# Patient Record
Sex: Male | Born: 1955
Health system: Southern US, Community
[De-identification: ages and names within clinical notes are randomized; demographics above are authoritative.]

## PROBLEM LIST (undated history)

## (undated) DIAGNOSIS — R569 Unspecified convulsions: Secondary | ICD-10-CM

## (undated) DIAGNOSIS — F4310 Post-traumatic stress disorder, unspecified: Secondary | ICD-10-CM

## (undated) DIAGNOSIS — E559 Vitamin D deficiency, unspecified: Secondary | ICD-10-CM

## (undated) DIAGNOSIS — M545 Low back pain: Secondary | ICD-10-CM

## (undated) DIAGNOSIS — R42 Dizziness and giddiness: Secondary | ICD-10-CM

## (undated) DIAGNOSIS — R6882 Decreased libido: Secondary | ICD-10-CM

## (undated) DIAGNOSIS — F32A Depression, unspecified: Secondary | ICD-10-CM

## (undated) DIAGNOSIS — K219 Gastro-esophageal reflux disease without esophagitis: Secondary | ICD-10-CM

## (undated) DIAGNOSIS — J45909 Unspecified asthma, uncomplicated: Secondary | ICD-10-CM

## (undated) DIAGNOSIS — C439 Malignant melanoma of skin, unspecified: Secondary | ICD-10-CM

## (undated) DIAGNOSIS — F329 Major depressive disorder, single episode, unspecified: Secondary | ICD-10-CM

## (undated) DIAGNOSIS — H409 Unspecified glaucoma: Secondary | ICD-10-CM

## (undated) DIAGNOSIS — K589 Irritable bowel syndrome without diarrhea: Secondary | ICD-10-CM

## (undated) DIAGNOSIS — G4730 Sleep apnea, unspecified: Secondary | ICD-10-CM

## (undated) DIAGNOSIS — G43909 Migraine, unspecified, not intractable, without status migrainosus: Secondary | ICD-10-CM

## (undated) HISTORY — PX: OTHER SURGICAL HISTORY: SHX169

## (undated) HISTORY — DX: Unspecified asthma, uncomplicated: J45.909

## (undated) HISTORY — DX: Gastro-esophageal reflux disease without esophagitis: K21.9

## (undated) HISTORY — PX: CHOLECYSTECTOMY: SHX55

## (undated) HISTORY — DX: Low back pain: M54.5

## (undated) HISTORY — DX: Major depressive disorder, single episode, unspecified: F32.9

## (undated) HISTORY — DX: Dizziness and giddiness: R42

## (undated) HISTORY — PX: SPLENECTOMY: SUR1306

## (undated) HISTORY — DX: Irritable bowel syndrome, unspecified: K58.9

## (undated) HISTORY — DX: Sleep apnea, unspecified: G47.30

## (undated) HISTORY — DX: Malignant melanoma of skin, unspecified: C43.9

## (undated) HISTORY — DX: Unspecified glaucoma: H40.9

## (undated) HISTORY — DX: Migraine, unspecified, not intractable, without status migrainosus: G43.909

## (undated) HISTORY — DX: Post-traumatic stress disorder, unspecified: F43.10

## (undated) HISTORY — DX: Vitamin D deficiency, unspecified: E55.9

## (undated) HISTORY — DX: Unspecified convulsions: R56.9

## (undated) HISTORY — DX: Depression, unspecified: F32.A

## (undated) HISTORY — DX: Decreased libido: R68.82

---

## 1996-04-13 DIAGNOSIS — C439 Malignant melanoma of skin, unspecified: Secondary | ICD-10-CM

## 1996-04-13 HISTORY — DX: Malignant melanoma of skin, unspecified: C43.9

## 1998-12-25 ENCOUNTER — Encounter: Payer: Self-pay | Admitting: Surgery

## 1998-12-26 ENCOUNTER — Encounter: Payer: Self-pay | Admitting: Surgery

## 1998-12-26 ENCOUNTER — Inpatient Hospital Stay (HOSPITAL_COMMUNITY): Admission: RE | Admit: 1998-12-26 | Discharge: 1999-01-02 | Payer: Self-pay | Admitting: Surgery

## 1999-04-14 HISTORY — PX: OTHER SURGICAL HISTORY: SHX169

## 2002-08-07 ENCOUNTER — Ambulatory Visit (HOSPITAL_COMMUNITY): Admission: RE | Admit: 2002-08-07 | Discharge: 2002-08-07 | Payer: Self-pay | Admitting: Internal Medicine

## 2002-08-07 ENCOUNTER — Encounter: Payer: Self-pay | Admitting: Internal Medicine

## 2002-08-08 ENCOUNTER — Inpatient Hospital Stay (HOSPITAL_COMMUNITY): Admission: AD | Admit: 2002-08-08 | Discharge: 2002-08-12 | Payer: Self-pay | Admitting: Internal Medicine

## 2002-08-08 ENCOUNTER — Encounter: Payer: Self-pay | Admitting: Internal Medicine

## 2002-08-09 ENCOUNTER — Encounter: Payer: Self-pay | Admitting: Gastroenterology

## 2002-08-09 ENCOUNTER — Encounter (INDEPENDENT_AMBULATORY_CARE_PROVIDER_SITE_OTHER): Payer: Self-pay | Admitting: Specialist

## 2002-08-10 ENCOUNTER — Encounter: Payer: Self-pay | Admitting: Internal Medicine

## 2004-09-30 ENCOUNTER — Ambulatory Visit (HOSPITAL_COMMUNITY): Admission: RE | Admit: 2004-09-30 | Discharge: 2004-09-30 | Payer: Self-pay | Admitting: Pediatrics

## 2005-10-21 ENCOUNTER — Ambulatory Visit: Payer: Self-pay | Admitting: Family Medicine

## 2006-01-11 ENCOUNTER — Ambulatory Visit: Payer: Self-pay | Admitting: Family Medicine

## 2006-09-23 ENCOUNTER — Ambulatory Visit: Payer: Self-pay | Admitting: Family Medicine

## 2006-11-17 ENCOUNTER — Ambulatory Visit: Payer: Self-pay | Admitting: Family Medicine

## 2007-01-17 ENCOUNTER — Ambulatory Visit: Payer: Self-pay | Admitting: Family Medicine

## 2007-01-31 ENCOUNTER — Ambulatory Visit: Payer: Self-pay | Admitting: Family Medicine

## 2007-03-29 ENCOUNTER — Ambulatory Visit: Payer: Self-pay | Admitting: Family Medicine

## 2007-06-12 LAB — HM COLONOSCOPY: HM Colonoscopy: NORMAL

## 2008-01-16 ENCOUNTER — Ambulatory Visit: Payer: Self-pay | Admitting: Family Medicine

## 2008-06-20 ENCOUNTER — Encounter: Admission: RE | Admit: 2008-06-20 | Discharge: 2008-06-20 | Payer: Self-pay | Admitting: Family Medicine

## 2008-06-20 ENCOUNTER — Ambulatory Visit: Payer: Self-pay | Admitting: Family Medicine

## 2008-07-06 ENCOUNTER — Encounter: Payer: Self-pay | Admitting: Internal Medicine

## 2008-07-06 DIAGNOSIS — G4730 Sleep apnea, unspecified: Secondary | ICD-10-CM | POA: Insufficient documentation

## 2008-07-06 DIAGNOSIS — K8020 Calculus of gallbladder without cholecystitis without obstruction: Secondary | ICD-10-CM | POA: Insufficient documentation

## 2008-07-06 DIAGNOSIS — R569 Unspecified convulsions: Secondary | ICD-10-CM | POA: Insufficient documentation

## 2008-08-30 ENCOUNTER — Ambulatory Visit: Payer: Self-pay | Admitting: Internal Medicine

## 2008-08-30 DIAGNOSIS — I499 Cardiac arrhythmia, unspecified: Secondary | ICD-10-CM | POA: Insufficient documentation

## 2008-08-30 DIAGNOSIS — F4310 Post-traumatic stress disorder, unspecified: Secondary | ICD-10-CM

## 2008-08-30 DIAGNOSIS — R55 Syncope and collapse: Secondary | ICD-10-CM

## 2008-08-30 DIAGNOSIS — R51 Headache: Secondary | ICD-10-CM

## 2008-08-30 DIAGNOSIS — G43909 Migraine, unspecified, not intractable, without status migrainosus: Secondary | ICD-10-CM | POA: Insufficient documentation

## 2008-08-30 DIAGNOSIS — J45909 Unspecified asthma, uncomplicated: Secondary | ICD-10-CM | POA: Insufficient documentation

## 2008-08-30 DIAGNOSIS — J40 Bronchitis, not specified as acute or chronic: Secondary | ICD-10-CM

## 2008-08-30 DIAGNOSIS — R519 Headache, unspecified: Secondary | ICD-10-CM | POA: Insufficient documentation

## 2008-08-30 DIAGNOSIS — H409 Unspecified glaucoma: Secondary | ICD-10-CM | POA: Insufficient documentation

## 2008-08-30 DIAGNOSIS — Z9189 Other specified personal risk factors, not elsewhere classified: Secondary | ICD-10-CM | POA: Insufficient documentation

## 2008-08-30 DIAGNOSIS — Z9089 Acquired absence of other organs: Secondary | ICD-10-CM

## 2008-08-30 DIAGNOSIS — K589 Irritable bowel syndrome without diarrhea: Secondary | ICD-10-CM

## 2008-08-30 DIAGNOSIS — K219 Gastro-esophageal reflux disease without esophagitis: Secondary | ICD-10-CM

## 2008-12-26 ENCOUNTER — Ambulatory Visit: Payer: Self-pay | Admitting: Internal Medicine

## 2009-04-13 DIAGNOSIS — M545 Low back pain, unspecified: Secondary | ICD-10-CM

## 2009-04-13 HISTORY — DX: Low back pain, unspecified: M54.50

## 2009-07-16 ENCOUNTER — Ambulatory Visit: Payer: Self-pay | Admitting: Internal Medicine

## 2009-07-16 DIAGNOSIS — M545 Low back pain: Secondary | ICD-10-CM

## 2009-07-25 ENCOUNTER — Encounter: Payer: Self-pay | Admitting: Internal Medicine

## 2009-08-28 ENCOUNTER — Ambulatory Visit: Payer: Self-pay | Admitting: Internal Medicine

## 2009-08-30 ENCOUNTER — Ambulatory Visit (HOSPITAL_COMMUNITY): Admission: RE | Admit: 2009-08-30 | Discharge: 2009-08-30 | Payer: Self-pay | Admitting: Internal Medicine

## 2009-09-24 ENCOUNTER — Encounter: Payer: Self-pay | Admitting: Internal Medicine

## 2009-11-25 ENCOUNTER — Encounter: Payer: Self-pay | Admitting: Internal Medicine

## 2010-01-13 ENCOUNTER — Encounter: Payer: Self-pay | Admitting: Internal Medicine

## 2010-01-14 ENCOUNTER — Telehealth: Payer: Self-pay | Admitting: Internal Medicine

## 2010-01-31 ENCOUNTER — Encounter: Payer: Self-pay | Admitting: Internal Medicine

## 2010-02-27 ENCOUNTER — Ambulatory Visit (HOSPITAL_COMMUNITY)
Admission: RE | Admit: 2010-02-27 | Discharge: 2010-02-28 | Payer: Self-pay | Source: Home / Self Care | Admitting: Neurological Surgery

## 2010-03-13 HISTORY — PX: MICRODISCECTOMY LUMBAR: SUR864

## 2010-03-21 ENCOUNTER — Encounter: Payer: Self-pay | Admitting: Internal Medicine

## 2010-04-16 ENCOUNTER — Encounter: Payer: Self-pay | Admitting: Internal Medicine

## 2010-04-16 ENCOUNTER — Telehealth: Payer: Self-pay | Admitting: Internal Medicine

## 2010-05-01 ENCOUNTER — Ambulatory Visit
Admission: RE | Admit: 2010-05-01 | Discharge: 2010-05-01 | Payer: Self-pay | Source: Home / Self Care | Attending: Internal Medicine | Admitting: Internal Medicine

## 2010-05-01 DIAGNOSIS — R42 Dizziness and giddiness: Secondary | ICD-10-CM | POA: Insufficient documentation

## 2010-05-01 DIAGNOSIS — B359 Dermatophytosis, unspecified: Secondary | ICD-10-CM | POA: Insufficient documentation

## 2010-05-11 LAB — CONVERTED CEMR LAB
ALT: 49 units/L (ref 0–53)
AST: 33 units/L (ref 0–37)
Albumin: 4.5 g/dL (ref 3.5–5.2)
Basophils Absolute: 0 10*3/uL (ref 0.0–0.1)
Basophils Relative: 0 % (ref 0.0–3.0)
Cholesterol: 224 mg/dL — ABNORMAL HIGH (ref 0–200)
Creatinine, Ser: 1 mg/dL (ref 0.4–1.5)
Eosinophils Absolute: 0.1 10*3/uL (ref 0.0–0.7)
Eosinophils Relative: 0.9 % (ref 0.0–5.0)
GFR calc non Af Amer: 82.65 mL/min (ref >60)
Lymphs Abs: 3.6 10*3/uL (ref 0.7–4.0)
Monocytes Absolute: 0.8 10*3/uL (ref 0.1–1.0)
Neutrophils Relative %: 53.7 % (ref 43.0–77.0)
Potassium: 4.4 meq/L (ref 3.5–5.1)
Total Bilirubin: 1.4 mg/dL — ABNORMAL HIGH (ref 0.3–1.2)
Total Protein: 7.6 g/dL (ref 6.0–8.3)
Triglycerides: 148 mg/dL (ref 0.0–149.0)
VLDL: 29.6 mg/dL (ref 0.0–40.0)
WBC: 9.7 10*3/uL (ref 4.5–10.5)

## 2010-05-13 NOTE — Progress Notes (Signed)
Summary: oxycodone refill - back pain  Phone Note Refill Request Call back at Home Phone [phone] Message from:  Patient via wife in ov today on January 14, 2010 4:33 PM  Refills Requested: Medication #1:  OXYCODONE-ACETAMINOPHEN 5-325 MG TABS 1 by mouth every 8 hours as needed for pain   Last Refilled: 12/05/2009 wife will take rx to pt to be filled - signed and given to wife by me today  Initial call taken by: Newt Lukes MD,  January 14, 2010 4:36 PM    New/Updated Medications: OXYCODONE-ACETAMINOPHEN 5-325 MG TABS (OXYCODONE-ACETAMINOPHEN) 1 by mouth every 8 hours as needed for pain Prescriptions: OXYCODONE-ACETAMINOPHEN 5-325 MG TABS (OXYCODONE-ACETAMINOPHEN) 1 by mouth every 8 hours as needed for pain  #40 x 0   Entered and Authorized by:   Newt Lukes MD   Signed by:   Newt Lukes MD on 01/14/2010   Method used:   Print then Give to Patient   RxID:   [id number]

## 2010-05-13 NOTE — Letter (Signed)
Summary: [facility]  [facility]   Imported By: Sherian Rein 01/30/2010 14:21:14  _____________________________________________________________________  External Attachment:    Type:   Image     Comment:   External Document

## 2010-05-13 NOTE — Medication Information (Signed)
Summary: Meloxicam/[facility]  [facility]oxicam/[facility]   Imported By: Sherian Rein 07/29/2009 12:12:21  _____________________________________________________________________  External Attachment:    Type:   Image     Comment:   External Document

## 2010-05-13 NOTE — Assessment & Plan Note (Signed)
Summary: BACK PROBLEMS/NWS  #   Vital Signs:  Patient profile:   55 year old male Height:      64.5 inches (163.83 cm) Weight:      146.0 pounds (66.36 kg) O2 Sat:      97 % on Room air Temp:     98.0 degrees F (36.67 degrees C) oral Pulse rate:   59 / minute BP sitting:   120 / 76  (left arm) Cuff size:   regular  Vitals Entered By: Orlan Leavens (Aug 28, 2009 9:03 AM)  O2 Flow:  Room air CC: Back problems Is Patient Diabetic? No Pain Assessment Patient in pain? yes     Location: lower back Type: aching   Primary Care Provider:  Newt Lukes MD  CC:  Back problems.  History of Present Illness:  Back Pain      This is a 55 year old man who presents with recurrent Back pain.  The symptoms began almost 6 months ago (mid Dec 2010).  The intensity is described as moderate.  pain is intermittent.  Pain was treated here previously with pred pak x 6 days - relieved pain completedly for 3 weeks, now back again as before. has also tried ice, heat and acupunture with incomplete relief and return of symptoms. Hx similar pain each winter for last 3 years.  The patient denies fever, chills, weakness, loss of sensation, fecal incontinence, urinary incontinence, urinary retention, dysuria, inability to work, and inability to care for self.  The pain is located in the right low back > left low back, and mid low back.  The pain began at home, after lifting, and after overuse.  The pain radiates to the right buttock > left buttock.  The pain is made worse by standing or walking.  The pain is made better by inactivity, NSAID medications, and muscle relaxants.  Risk factors for serious underlying conditions include duration of pain > 56 month.    Current Medications (verified): 1)  Protonix 40 Mg Tbec (Pantoprazole Sodium) .... Take Prn 2)  Hydrocodone-Acetaminophen 5-500 Mg Tabs (Hydrocodone-Acetaminophen) .Marland Kitchen.. 1 By Mouth Q8h As Needed Severe Pain 3)  Advair Diskus 100-50 Mcg/dose Misc  (Fluticasone-Salmeterol) .... Use Prn 4)  Zoloft 50 Mg Tabs (Sertraline Hcl) .... Take 1 By Mouth Qd 5)  Mobic 15 Mg Tabs (Meloxicam) .Marland Kitchen.. 1 By Mouth Once Daily As Needed For Pain  Allergies (verified): 1)  ! Benadryl 2)  ! * All 5-Ht Drugs 3)  ! Morphine  Past History:  Past Medical History: Cholelithiasis Seizure disorder Asthma GERD Blood transfusion PTSD/depression  melanoma - scalp  MD rooster: ortho - daldorf psyc - [facility]  Review of Systems  The patient denies fever, weight loss, headaches, abdominal pain, muscle weakness, suspicious skin lesions, and difficulty walking.    Physical Exam  General:  alert, well-developed, well-nourished, and cooperative to examination.  Lungs:  normal respiratory effort, no intercostal retractions or use of accessory muscles; normal breath sounds bilaterally - no crackles and no wheezes.    Heart:  normal rate, regular rhythm, no murmur, and no rub. BLE without edema.  Msk:  back: full range of motion of lumbar spine. Nontender to palpation. Negative straight leg raise. Deep tendon reflexes symmetrically intact at Achilles and patella, negative clonus. Sensation intact throughout all dermatomes in bilateral lower extremities. Full strength to manual muscle testing in all major muscule groups including EHL, anterior tibialis, gastrocnemius, quadriceps, and iliopsoas. Able to heel and toe walk without  difficulty and ambulates with a normal gait.  Neurologic:  alert & oriented X3 and cranial nerves II-XII symetrically intact.  strength normal in all extremities, sensation intact to light touch, and gait normal. speech fluent without dysarthria or aphasia  follows commands with good comprehension.    Impression & Recommendations:  Problem # 1:  LUMBAGO (ICD-724.2)  intermittent symptoms almost 6 mos currently, hx recurrent symptoms each winter last 3 years - improved with Mobic use - dramatically improved x 3 weeks after pred pack from  last OV- no neuro or Mskel decficits other than myofascial pain over lower lumbar region to palp - re-tx with pred pack and obtain MRI given classic radiculopathy radiation into RLE/quad to just below knee) prior xray with DDD, reviewed would want to see dr. Danielle Dess if needed His updated medication list for this problem includes:    Hydrocodone-acetaminophen 5-500 Mg Tabs (Hydrocodone-acetaminophen) .Marland Kitchen... 1 by mouth q8h as needed severe pain    Mobic 15 Mg Tabs (Meloxicam) .Marland Kitchen... 1 by mouth once daily as needed for pain  Orders: Prescription Created Electronically [phone]) Radiology Referral (Radiology)  Complete Medication List: 1)  Protonix 40 Mg Tbec (Pantoprazole sodium) .... Take prn 2)  Hydrocodone-acetaminophen 5-500 Mg Tabs (Hydrocodone-acetaminophen) .Marland Kitchen.. 1 by mouth q8h as needed severe pain 3)  Advair Diskus 100-50 Mcg/dose Misc (Fluticasone-salmeterol) .... Use prn 4)  Zoloft 50 Mg Tabs (Sertraline hcl) .... Take 1 by mouth qd 5)  Mobic 15 Mg Tabs (Meloxicam) .Marland Kitchen.. 1 by mouth once daily as needed for pain 6)  Prednisone (pak) 10 Mg Tabs (Prednisone) .... Take as directed x 6 days  Patient Instructions: 1)  it was good to see you today. 2)  treat pain with pred pak and continue mobic for antinflammatory effects as discussed - your prescription has been electronically submitted to your pharmacy. Please take as directed. Contact our office if you believe you're having problems with the medication(s).  3)  MRI of lumbar spine ordered today - Our office will contact you regarding this appointment once made.  4)  If pain symptoms become worse despite this treatment as discussed, let us know and we can consider further testing of referral to Neurosurg ([facility]) Prescriptions: PREDNISONE (PAK) 10 MG TABS (PREDNISONE) take as directed x 6 days  #1 x 0   Entered and Authorized by:   Newt Lukes MD   Signed by:   Newt Lukes MD on 08/28/2009   Method used:   Electronically to          [facility] [address] [phone]* (retail)       [address]       [place], [place]  [address]       Ph: [phone] or [phone]       Fax: [phone]   RxID:   [id number]

## 2010-05-13 NOTE — Letter (Signed)
Summary: [facility]  [facility]   Imported By: Lester  02/13/2010 10:38:30  _____________________________________________________________________  External Attachment:    Type:   Image     Comment:   External Document

## 2010-05-13 NOTE — Consult Note (Signed)
Summary: [facility]  [facility]   Imported By: Sherian Rein 10/08/2009 15:26:16  _____________________________________________________________________  External Attachment:    Type:   Image     Comment:   External Document

## 2010-05-13 NOTE — Assessment & Plan Note (Signed)
Summary: lower back pain/#/cd   Vital Signs:  Patient profile:   55 year old male Height:      64.5 inches (163.83 cm) Weight:      145.4 pounds (66.09 kg) BMI:     24.66 O2 Sat:      94 % on Room air Temp:     97.4 degrees F (36.33 degrees C) oral Pulse rate:   59 / minute BP sitting:   118 / 70  (left arm) Cuff size:   regular  Vitals Entered By: Orlan Leavens (July 16, 2009 11:22 AM)  O2 Flow:  Room air CC: low back pain, Back pain Is Patient Diabetic? No Pain Assessment Patient in pain? no        Primary Care Provider:  Newt Lukes MD  CC:  low back pain and Back pain.  History of Present Illness:  Back Pain      This is a 55 year old man who presents with Back pain.  The symptoms began 3 months ago.  The intensity is described as moderate.  pain is intermittent, worse in past 3 days since stopping Mobic (which he was taking for his right knee). has tried ice, heat and acupunture with incomplete relief and return of symptoms. Hx similar pain each winter for last 3 years.  The patient denies fever, chills, weakness, loss of sensation, fecal incontinence, urinary incontinence, urinary retention, dysuria, inability to work, and inability to care for self.  The pain is located in the right low back, left low back, and mid low back.  The pain began at home, after lifting, and after overuse.  The pain radiates to the right buttock > left buttock.  The pain is made worse by standing or walking.  The pain is made better by inactivity, NSAID medications, and muscle relaxants.  Risk factors for serious underlying conditions include duration of pain > 1 month.    Current Medications (verified): 1)  Protonix 40 Mg Tbec (Pantoprazole Sodium) .... Take Prn 2)  Hydrocodone-Acetaminophen 5-500 Mg Tabs (Hydrocodone-Acetaminophen) .Marland Kitchen.. 1 By Mouth Q8h As Needed Severe Pain 3)  Advair Diskus 100-50 Mcg/dose Misc (Fluticasone-Salmeterol) .... Use Prn 4)  Zoloft 50 Mg Tabs (Sertraline Hcl)  .... Take 1 By Mouth Qd  Allergies (verified): 1)  ! Benadryl 2)  ! * All 5-Ht Drugs 3)  ! Morphine  Past History:  Past Medical History: Cholelithiasis Seizure disorder Asthma GERD Blood transfusion PTSD/depression -[facility] melanoma - scalp  MD rooster: ortho - daldorf  Review of Systems  The patient denies anorexia, weight loss, syncope, abdominal pain, incontinence, muscle weakness, and difficulty walking.    Physical Exam  General:  alert, well-developed, well-nourished, and cooperative to examination.   wife at side Msk:  back: full range of motion of lumbar spine. Nontender to palpation. Negative straight leg raise. Deep tendon reflexes symmetrically intact at Achilles and patella, negative clonus. Sensation intact throughout all dermatomes in bilateral lower extremities. Full strength to manual muscle testing in all major muscule groups including EHL, anterior tibialis, gastrocnemius, quadriceps, and iliopsoas. Able to heel and toe walk without difficulty and ambulates with a normal gait.  Neurologic:  alert & oriented X3 and cranial nerves II-XII symetrically intact.  strength normal in all extremities, sensation intact to light touch, and gait normal. speech fluent without dysarthria or aphasia  follows commands with good comprehension.  Psych:  Oriented X3, memory intact for recent and remote, normally interactive, good eye contact, not anxious appearing,  not depressed appearing, and not agitated.      Impression & Recommendations:  Problem # 1:  LUMBAGO (ICD-724.2) intermittent symptoms > 3mos currently, hx recurrent symptoms each winter last 3 years - improved with Mobic use - no neuro or Mskel decficits other than myofascial pain over lower lumbar region to palp - tx with pred pack and resume mobic - check xray r/o fx, loss of disc height or other problems - consider need for MRI if neuro deficit, worsening pain, or no change after antiiflam and pain meds -    also refer to PT - pt requests Nikki at [facility] for PT would want to see dr. Danielle Dess if needed His updated medication list for this problem includes:    Hydrocodone-acetaminophen 5-500 Mg Tabs (Hydrocodone-acetaminophen) .Marland Kitchen... 1 by mouth q8h as needed severe pain    Mobic 15 Mg Tabs (Meloxicam) .Marland Kitchen... 1 by mouth once daily as needed for pain  Orders: T-Lumbar Spine 2 Views ([id number]) Prescription Created Electronically [phone]) Physical Therapy Referral (PT)  Complete Medication List: 1)  Protonix 40 Mg Tbec (Pantoprazole sodium) .... Take prn 2)  Hydrocodone-acetaminophen 5-500 Mg Tabs (Hydrocodone-acetaminophen) .Marland Kitchen.. 1 by mouth q8h as needed severe pain 3)  Advair Diskus 100-50 Mcg/dose Misc (Fluticasone-salmeterol) .... Use prn 4)  Zoloft 50 Mg Tabs (Sertraline hcl) .... Take 1 by mouth qd 5)  Mobic 15 Mg Tabs (Meloxicam) .Marland Kitchen.. 1 by mouth once daily as needed for pain 6)  Prednisone (pak) 10 Mg Tabs (Prednisone) .... As directed x 6 days  Patient Instructions: 1)  it was good to see you today. 2)  treat pain with pred pak and mobic for antinflammatory effects as discussed - your prescriptions have been electronically submitted to your pharmacy. Please take as directed. Contact our office if you believe you're having problems with the medication(s).  3)  xray of lumbar spine ordered today - your results will be posted on the phone tree for review in 48-72 hours from the time of test completion; call [phone] and enter your 9 digit MRN (listed above on this page, just below your name); if any changes need to be made or there are abnormal results, you will be contacted directly.  4)  we'll make referral to [place] [facility]. Our office will contact you regarding this appointment once made.  5)  If pain symptoms become worse despite this treatment as discussed, let us know and we can consider further testing of referral to Neurosurg  ([facility]) Prescriptions: HYDROCODONE-ACETAMINOPHEN 5-500 MG TABS (HYDROCODONE-ACETAMINOPHEN) 1 by mouth q8h as needed severe pain  #30 x 2   Entered by:   Orlan Leavens   Authorized by:   Newt Lukes MD   Signed by:   Orlan Leavens on 07/16/2009   Method used:   Print then Give to Patient   RxID:   [id number] PREDNISONE (PAK) 10 MG TABS (PREDNISONE) as directed x 6 days  #1 x 0   Entered and Authorized by:   Newt Lukes MD   Signed by:   Newt Lukes MD on 07/16/2009   Method used:   Electronically to        [facility] [address] [phone]* (retail)       [address]       [place], [place]  [address]       Ph: [phone] or [phone]       Fax: [phone]  RxID:   [id number] MOBIC 15 MG TABS (MELOXICAM) 1 by mouth once daily as needed for pain  #30 x 5   Entered and Authorized by:   Newt Lukes MD   Signed by:   Newt Lukes MD on 07/16/2009   Method used:   Electronically to        [facility] [address] [phone]* (retail)       [address]       [place], [place]  [address]       Ph: [phone] or [phone]       Fax: [phone]   RxID:   [id number]   Appended Document: lower back pain/#/cd    Clinical Lists Changes  Medications: Rx of MOBIC 15 MG TABS (MELOXICAM) 1 by mouth once daily as needed for pain;  #90 x 1 Brand medically necessary;  Signed;  Entered by: Orlan Leavens;  Authorized by: Newt Lukes MD;  Method used: [facility]ed to [facility], , , [place]  , Ph: [phone], Fax: [phone]    Prescriptions: MOBIC 15 MG TABS (MELOXICAM) 1 by mouth once daily as needed for pain Brand medically necessary #90 x 1   Entered by:   Orlan Leavens   Authorized by:   Newt Lukes MD   Signed by:   Orlan Leavens on 07/16/2009   Method used:   Faxed to ...       [facility])             , [place]         Ph: [phone]       Fax: [phone]   RxID:    [id number]

## 2010-05-13 NOTE — Letter (Signed)
Summary: [facility]  [facility]   Imported By: Sherian Rein 12/04/2009 14:14:52  _____________________________________________________________________  External Attachment:    Type:   Image     Comment:   External Document

## 2010-05-14 ENCOUNTER — Other Ambulatory Visit: Payer: Self-pay | Admitting: Neurological Surgery

## 2010-05-14 DIAGNOSIS — M48061 Spinal stenosis, lumbar region without neurogenic claudication: Secondary | ICD-10-CM

## 2010-05-15 NOTE — Letter (Signed)
Summary: [facility]  [facility]   Imported By: Sherian Rein 04/09/2010 08:20:20  _____________________________________________________________________  External Attachment:    Type:   Image     Comment:   External Document

## 2010-05-15 NOTE — Letter (Signed)
Summary: Consult/[facility]  Consult/[facility]   Imported By: Sherian Rein 04/24/2010 09:08:33  _____________________________________________________________________  External Attachment:    Type:   Image     Comment:   External Document
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
## 2010-05-15 NOTE — Letter (Signed)
Summary: [facility]  [facility]   Imported By: Lennie Odor 04/24/2010 16:52:41  _____________________________________________________________________  External Attachment:    Type:   Image     Comment:   External Document

## 2010-05-15 NOTE — Progress Notes (Signed)
Summary: Syncope and dizziness  Phone Note Call from Patient Call back at Home Phone [phone]   Caller: Spouse Summary of Call: Pt's spouse called stating that pt has been having svere dizziness, nausea and syncope apon standing or changing position, pt is so severely dizzy he is unable to get up to use the restroom. I called back and spoke with pt's wife sho said that pt beleived it to be related to recent back surgery, stating that he felt the same way after surgery and he thought that they did "something to get him feeling better". I advised that Dr Danielle Dess (NS) be contacted but for more immediate treatment pt should be taken to ER asap for eval. Spouse agreed. Initial call taken by: Margaret Pyle, CMA,  April 16, 2010 8:56 AM

## 2010-05-20 ENCOUNTER — Ambulatory Visit
Admission: RE | Admit: 2010-05-20 | Discharge: 2010-05-20 | Disposition: A | Discharge status: Home / Self Care | Payer: Medicare Other | Source: Ambulatory Visit | Attending: Neurological Surgery | Admitting: Neurological Surgery

## 2010-05-20 DIAGNOSIS — M48061 Spinal stenosis, lumbar region without neurogenic claudication: Secondary | ICD-10-CM

## 2010-05-20 MED ORDER — GADOBENATE DIMEGLUMINE 529 MG/ML IV SOLN
13.0000 mL | Freq: Once | INTRAVENOUS | Status: AC | PRN
  Administered 2010-05-20: 13 mL via INTRAVENOUS

## 2010-05-21 ENCOUNTER — Encounter: Payer: Self-pay | Admitting: Internal Medicine

## 2010-05-29 ENCOUNTER — Other Ambulatory Visit: Payer: Self-pay

## 2010-06-04 NOTE — Letter (Signed)
Summary: [facility]  [facility]   Imported By: Sherian Rein 05/29/2010 13:49:03  _____________________________________________________________________  External Attachment:    Type:   Image     Comment:   External Document

## 2010-06-05 ENCOUNTER — Encounter: Payer: Self-pay | Admitting: Internal Medicine

## 2010-06-05 ENCOUNTER — Other Ambulatory Visit: Payer: BC Managed Care – PPO

## 2010-06-05 ENCOUNTER — Encounter (INDEPENDENT_AMBULATORY_CARE_PROVIDER_SITE_OTHER): Payer: BC Managed Care – PPO | Admitting: Internal Medicine

## 2010-06-05 ENCOUNTER — Other Ambulatory Visit: Payer: Self-pay | Admitting: Internal Medicine

## 2010-06-05 DIAGNOSIS — R6882 Decreased libido: Secondary | ICD-10-CM

## 2010-06-05 DIAGNOSIS — Z13820 Encounter for screening for osteoporosis: Secondary | ICD-10-CM

## 2010-06-05 DIAGNOSIS — M545 Low back pain: Secondary | ICD-10-CM

## 2010-06-05 DIAGNOSIS — E785 Hyperlipidemia, unspecified: Secondary | ICD-10-CM

## 2010-06-05 DIAGNOSIS — Z0000 Encounter for general adult medical examination without abnormal findings: Secondary | ICD-10-CM

## 2010-06-05 LAB — CBC WITH DIFFERENTIAL/PLATELET
Basophils Absolute: 0 10*3/uL (ref 0.0–0.1)
Basophils Relative: 0.4 % (ref 0.0–3.0)
Eosinophils Absolute: 0.2 10*3/uL (ref 0.0–0.7)
Eosinophils Relative: 1.6 % (ref 0.0–5.0)
HCT: 47.1 % (ref 39.0–52.0)
Hemoglobin: 16.8 g/dL (ref 13.0–17.0)
Lymphocytes Relative: 41.9 % (ref 12.0–46.0)
Lymphs Abs: 4.2 10*3/uL — ABNORMAL HIGH (ref 0.7–4.0)
MCHC: 35.6 g/dL (ref 30.0–36.0)
MCV: 90.9 fl (ref 78.0–100.0)
Monocytes Absolute: 0.9 10*3/uL (ref 0.1–1.0)
Monocytes Relative: 9 % (ref 3.0–12.0)
Neutro Abs: 4.7 10*3/uL (ref 1.4–7.7)
Neutrophils Relative %: 47.1 % (ref 43.0–77.0)
Platelets: 320 10*3/uL (ref 150.0–400.0)
RBC: 5.18 Mil/uL (ref 4.22–5.81)
RDW: 14 % (ref 11.5–14.6)
WBC: 10 10*3/uL (ref 4.5–10.5)

## 2010-06-05 LAB — URINALYSIS, ROUTINE W REFLEX MICROSCOPIC
Bilirubin Urine: NEGATIVE
Hgb urine dipstick: NEGATIVE
Ketones, ur: NEGATIVE
Leukocytes, UA: NEGATIVE
Nitrite: NEGATIVE
Specific Gravity, Urine: <=1.005 (ref 1.000–1.030)
Total Protein, Urine: NEGATIVE
Urine Glucose: NEGATIVE
Urobilinogen, UA: 0.2 (ref 0.0–1.0)
pH: 6 (ref 5.0–8.0)

## 2010-06-05 LAB — BASIC METABOLIC PANEL
Creatinine, Ser: 1 mg/dL (ref 0.4–1.5)
Glucose, Bld: 96 mg/dL (ref 70–99)

## 2010-06-05 LAB — LIPID PANEL
HDL: 36.3 mg/dL — ABNORMAL LOW (ref >39.00)
Total CHOL/HDL Ratio: 6

## 2010-06-05 LAB — HEPATIC FUNCTION PANEL
ALT: 36 U/L (ref 0–53)
AST: 21 U/L (ref 0–37)
Albumin: 4.3 g/dL (ref 3.5–5.2)
Alkaline Phosphatase: 52 U/L (ref 39–117)
Bilirubin, Direct: 0.1 mg/dL (ref 0.0–0.3)
Total Bilirubin: 0.7 mg/dL (ref 0.3–1.2)
Total Protein: 6.8 g/dL (ref 6.0–8.3)

## 2010-06-05 LAB — LDL CHOLESTEROL, DIRECT: Direct LDL: 152.1 mg/dL

## 2010-06-05 LAB — TSH: TSH: 1.54 u[IU]/mL (ref 0.35–5.50)

## 2010-06-05 LAB — PSA: PSA: 1.33 ng/mL (ref 0.10–4.00)

## 2010-06-06 DIAGNOSIS — E559 Vitamin D deficiency, unspecified: Secondary | ICD-10-CM | POA: Insufficient documentation

## 2010-06-10 ENCOUNTER — Encounter: Payer: Self-pay | Admitting: Internal Medicine
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
## 2010-06-19 NOTE — Medication Information (Signed)
Summary: Clarification for [facility]  Clarification for [facility]   Imported By: Sherian Rein 06/11/2010 10:19:35  _____________________________________________________________________  External Attachment:    Type:   Image     Comment:   External Document

## 2010-06-24 LAB — SURGICAL PCR SCREEN: MRSA, PCR: NEGATIVE

## 2010-06-24 LAB — CBC
HCT: 45.3 % (ref 39.0–52.0)
MCH: 32.3 pg (ref 26.0–34.0)
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
## 2010-08-29 NOTE — Discharge Summary (Signed)
NAME:  Brent Sandoval, Brent Sandoval                        ACCOUNT NO.:  [id number]   MEDICAL RECORD NO.:  [id number]                   PATIENT TYPE:  INP   LOCATION:  5715                                 FACILITY:  [facility]   PHYSICIAN:  Titus Dubin. Alwyn Ren, M.D. [facility]         DATE OF BIRTH:  07-28-1955   DATE OF ADMISSION:  08/08/2002  DATE OF DISCHARGE:  08/12/2002                                 DISCHARGE SUMMARY   ADMISSION DIAGNOSIS:  Abdominal pain.   DISCHARGE DIAGNOSIS:  Abdominal pain.   BRIEF HISTORY:  The patient was admitted with left-sided abdominal pain  which has become constant.  Prior to admission, he was taking Ambien and  Vicodin, but he was concerned that this might lower his threshold for  seizures.  He has had a seizure disorder in the setting of previous  electrocution.  The abdominal pain actually began after the electrocution  injury.  The past history also includes a splenectomy for splenomegaly.  He  states that there was question that his spleen might be causing the pain,  but the pain persisted after removal of same.   HOSPITAL COURSE:  He was admitted and placed on clear liquids and IV pain  medications.   On April 27 at 2300 hours, he was observed by the nurse to be having a  seizure.  He had received morphine sulfate 2 mg at 2045 and Toradol 30 mg IV  and Ambien 10 mg at 2200.  The seizure, according to the patient's wife, was  proceeded by slurred speech and then convulsive activity.  The wife  described this seizure as small one.  The tonic-clonic activity seemed to  be localized to the right lower extremity with twisting rotation of the  torso.  The event lasted for approximately 7 minutes and then gradually  resolved.  There was no respiratory compromise, and heart rate remained in  the 60s.  Adjustments were made in pain medication to preclude pain-induced  seizure activity.   He was seen in consultation by Dr. Barnet Pall.   EGD report revealed severe  inflammatory changes in stomach and duodenum for  which Protonix was prescribed.   MRI revealed a 4 cm mass in the pancreatic tail which proved to be a  hypertrophied accessory splenic nodule.  He was seen in consultation by  general surgery.  Removal of splenic tissue was not indicated according to  the surgeon as it was felt to be functioning or assisting in his immune  status.   Neurology evaluation for further treatment of neurogenic pain relative to  the electrocution was recommended.  This was discussed with the patient who  declined.   His white count was normal at 8000, hematocrit 39.4.  Sed rate was 3.  Clotting studies were normal.   No significant narrowing of mesenteric vessels were noted on MRA of the  abdomen with and without contrast.  CT scan of the abdomen prior  to MRI/MRA  was negative except for previous cholecystectomy and splenectomy.   CT films revealed no diverticulitis or appendicitis.  There were no ureteral  stones.   Pan endoscopy was performed April 28 by Dr. Barnet Pall and, as noted,  revealed duodenitis without hemorrhage and acute gastritis.   On the day of discharge, the patient was afebrile and felt that he was at  his baseline.  He declined pain medications and requested the IV line be  removed.  He had no narcotics for over 24 hours and no Zofran for over 12  hours.  He wanted to have his diet advanced to include solids.  It is to be  noted that he is a self-described vegetarian.   Bowel sounds were decreased, but the abdomen was soft.  There was no  evidence of any musculoskeletal component to his pain.   We discussed other options such as Neurontin, but he was reluctant to employ  this as it had caused problems with his seizure disorder in the past.   He was felt to be improved.  He was to increased his activity as tolerated.  He was given Protonix 40 mg each morning.  He was given the option of using  Prilosec over-the-counter if the  co-pay for the Protonix was excessive.  He  was also placed on Hyoscyamine sulfate 0.375 every 12 hours as needed.   Other pain medications and sleeping pills were declined by the patient.   He was to reintroduce complex foods such as dairy or grease very slowly.   He requested that a copy of the Discharge Summary be sent to Dr. Leonard Downing at  [facility] and also to Dr. Illene Regulus, who will serve as his  primary care doctor.   The patient is a very intelligent, multilingual (at least five language) who  is quite knowledgeable in reference to his multiple medical issues.  He is  to return to the emergency room should he have exacerbation of any  symptomatology.   Discussion was conducted concerning involvement in a pain center, possibly  [facility].  He made the comment that his managed care company was  considering a pain center in [place].  This can be pursued wiht Dr. Debby Bud  following discharge.                                               Titus Dubin. Alwyn Ren, M.D. [facility]    [facility]/MEDQ  D:  08/12/2002  T:  08/12/2002  Job:  [id number]   cc:   Rosalyn Gess. Norins, M.D. [facility]   Altamease Oiler, M.D.  [facility]  [facility] [place]. [facility] [address]  [place], [place] [address]

## 2010-09-01 ENCOUNTER — Inpatient Hospital Stay (HOSPITAL_COMMUNITY): Admission: RE | Admit: 2010-09-01 | Payer: Medicare Other | Source: Ambulatory Visit | Admitting: Neurological Surgery

## 2010-09-19 ENCOUNTER — Encounter: Payer: Self-pay | Admitting: Internal Medicine

## 2010-09-22 ENCOUNTER — Other Ambulatory Visit: Payer: BC Managed Care – PPO

## 2010-09-22 ENCOUNTER — Ambulatory Visit (INDEPENDENT_AMBULATORY_CARE_PROVIDER_SITE_OTHER): Payer: BC Managed Care – PPO | Admitting: Internal Medicine

## 2010-09-22 ENCOUNTER — Encounter: Payer: Self-pay | Admitting: Internal Medicine

## 2010-09-22 VITALS — BP 118/82 | HR 65 | Temp 97.6°F | Ht 64.5 in | Wt 151.4 lb

## 2010-09-22 DIAGNOSIS — M2550 Pain in unspecified joint: Secondary | ICD-10-CM

## 2010-09-22 DIAGNOSIS — R42 Dizziness and giddiness: Secondary | ICD-10-CM

## 2010-09-22 MED ORDER — PREDNISONE (PAK) 10 MG PO TABS: 10.0000 mg | ORAL_TABLET | ORAL | Status: AC

## 2010-09-22 NOTE — Patient Instructions (Addendum)
It was good to see you today. We have reviewed your prior records including labs and tests today Pred pak for ear symptoms - Your prescription(s) have been submitted to your pharmacy. Please take as directed and contact our office if you believe you are having problem(s) with the medication(s). Test(s) ordered today. Your results will be called to you after review (48-72hours after test completion). If any changes need to be made, you will be notified at that time. Please keep scheduled followup as planned (annually), call sooner if problems.
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
## 2010-09-22 NOTE — Assessment & Plan Note (Signed)
Intermittent, severe symptoms - no help with vesticular rehab or ENT eval - meds ineffective (meclizine, phenergan, valium) Working with chriopractor on same - will try pred pak at pt request

## 2010-09-23 LAB — ANA: Anti Nuclear Antibody(ANA): NEGATIVE

## 2010-09-23 LAB — RHEUMATOID FACTOR: Rheumatoid fact SerPl-aCnc: <10 [IU]/mL

## 2010-09-23 LAB — C-REACTIVE PROTEIN: CRP: 0.2 mg/dL (ref <0.6)

## 2010-11-10 ENCOUNTER — Ambulatory Visit (INDEPENDENT_AMBULATORY_CARE_PROVIDER_SITE_OTHER): Payer: BC Managed Care – PPO | Admitting: Internal Medicine

## 2010-11-10 ENCOUNTER — Encounter: Payer: Self-pay | Admitting: Internal Medicine

## 2010-11-10 VITALS — BP 132/80 | HR 71 | Temp 98.0°F | Ht 64.0 in | Wt 153.2 lb

## 2010-11-10 DIAGNOSIS — H8110 Benign paroxysmal vertigo, unspecified ear: Secondary | ICD-10-CM

## 2010-11-10 MED ORDER — DIAZEPAM 5 MG PO TABS: 5.0000 mg | ORAL_TABLET | Freq: Three times a day (TID) | ORAL | Status: DC | PRN
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
## 2010-11-10 NOTE — Patient Instructions (Signed)
It was good to see you today. Use Valium as discussed in addition to other medications for BPPV - Your prescription(s) have been given to you to submit to your pharmacy. Please take as directed and contact our office if you believe you are having problem(s) with the medication(s). we'll make referral to vestibular rehab at [facility]. Our office will contact you regarding appointment(s) once made. Please schedule followup in 2 months for physical and labs, call sooner if problems.

## 2010-12-02 ENCOUNTER — Ambulatory Visit: Payer: BC Managed Care – PPO | Admitting: Physical Therapy

## 2011-01-09 ENCOUNTER — Ambulatory Visit: Payer: BC Managed Care – PPO | Admitting: Internal Medicine

## 2011-01-14 ENCOUNTER — Encounter: Payer: Self-pay | Admitting: Internal Medicine

## 2011-01-14 ENCOUNTER — Ambulatory Visit (INDEPENDENT_AMBULATORY_CARE_PROVIDER_SITE_OTHER): Payer: BC Managed Care – PPO | Admitting: Internal Medicine

## 2011-01-14 VITALS — BP 110/78 | HR 63 | Temp 98.3°F | Ht 64.5 in | Wt 149.8 lb

## 2011-01-14 DIAGNOSIS — Z23 Encounter for immunization: Secondary | ICD-10-CM

## 2011-01-14 DIAGNOSIS — R42 Dizziness and giddiness: Secondary | ICD-10-CM

## 2011-01-14 DIAGNOSIS — H8110 Benign paroxysmal vertigo, unspecified ear: Secondary | ICD-10-CM

## 2011-01-14 DIAGNOSIS — G43909 Migraine, unspecified, not intractable, without status migrainosus: Secondary | ICD-10-CM

## 2011-01-14 DIAGNOSIS — F4310 Post-traumatic stress disorder, unspecified: Secondary | ICD-10-CM

## 2011-01-14 MED ORDER — AMITRIPTYLINE HCL 10 MG PO TABS: 10.0000 mg | ORAL_TABLET | Freq: Every day | ORAL | Status: DC

## 2011-01-14 MED ORDER — DIAZEPAM 5 MG PO TABS: 5.0000 mg | ORAL_TABLET | Freq: Three times a day (TID) | ORAL | Status: DC | PRN

## 2011-01-14 MED ORDER — OXYCODONE-ACETAMINOPHEN 5-325 MG PO TABS: 1.0000 | ORAL_TABLET | Freq: Three times a day (TID) | ORAL | Status: DC | PRN
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
## 2011-01-14 NOTE — Assessment & Plan Note (Signed)
Chronic events without change in nature of symptoms - 1-2 episodes per week, controls same with percocet prn Refills provided - follow up neuro prn

## 2011-01-14 NOTE — Assessment & Plan Note (Signed)
Intermittent, severe symptoms - no help with vesticular rehab or ENT eval 2011-  Most meds ineffective (meclizine, phenergan, valium) Continue vestibular therapy as tolerated Refill on meds

## 2011-01-14 NOTE — Patient Instructions (Addendum)
It was good to see you today. We have reviewed your prior records including labs and tests today Medications reviewed, no changes at this time. Refill on medication(s) as discussed today. Please schedule followup in 6 months for cholesterol check and Tdap shot, call sooner if problems.

## 2011-07-15 ENCOUNTER — Telehealth: Payer: Self-pay | Admitting: *Deleted

## 2011-07-15 ENCOUNTER — Encounter: Payer: Self-pay | Admitting: Internal Medicine

## 2011-07-15 ENCOUNTER — Ambulatory Visit (INDEPENDENT_AMBULATORY_CARE_PROVIDER_SITE_OTHER): Payer: BC Managed Care – PPO | Admitting: Internal Medicine

## 2011-07-15 VITALS — BP 130/70 | HR 69 | Temp 97.5°F | Ht 63.5 in | Wt 157.8 lb

## 2011-07-15 DIAGNOSIS — M545 Low back pain: Secondary | ICD-10-CM

## 2011-07-15 DIAGNOSIS — Z125 Encounter for screening for malignant neoplasm of prostate: Secondary | ICD-10-CM

## 2011-07-15 DIAGNOSIS — Z0000 Encounter for general adult medical examination without abnormal findings: Secondary | ICD-10-CM

## 2011-07-15 DIAGNOSIS — K589 Irritable bowel syndrome without diarrhea: Secondary | ICD-10-CM

## 2011-07-15 DIAGNOSIS — R42 Dizziness and giddiness: Secondary | ICD-10-CM

## 2011-07-15 DIAGNOSIS — H8110 Benign paroxysmal vertigo, unspecified ear: Secondary | ICD-10-CM

## 2011-07-15 MED ORDER — HYDROCORTISONE ACE-PRAMOXINE 1-1 % RE CREA: TOPICAL_CREAM | Freq: Two times a day (BID) | RECTAL | Status: AC

## 2011-07-15 MED ORDER — OXYCODONE-ACETAMINOPHEN 5-325 MG PO TABS: 1.0000 | ORAL_TABLET | Freq: Three times a day (TID) | ORAL | Status: AC | PRN

## 2011-07-15 MED ORDER — DIAZEPAM 5 MG PO TABS: 5.0000 mg | ORAL_TABLET | Freq: Three times a day (TID) | ORAL | Status: DC | PRN

## 2011-07-15 MED ORDER — PANTOPRAZOLE SODIUM 40 MG PO TBEC: 40.0000 mg | DELAYED_RELEASE_TABLET | Freq: Every day | ORAL | Status: DC

## 2011-07-15 NOTE — Assessment & Plan Note (Signed)
Intermittent, severe symptoms - no help with vesticular rehab or ENT eval 2011-  Ongoing chiropractic therapy at [facility] Most meds ineffective (meclizine, phenergan, valium) Continue vestibular therapy as tolerated Refill on meds  today

## 2011-07-15 NOTE — Telephone Encounter (Signed)
Message copied by Deatra James on Wed Jul 15, 2011 10:05 AM ------      Message from: COUSIN, SHARON T      Created: Wed Jul 15, 2011  9:46 AM      Regarding: PHY DATE:   01/13/12       THANKS

## 2011-07-15 NOTE — Telephone Encounter (Signed)
Received staff msg made cpx for 01/13/12 need cpx labs entered in epic... 07/15/11@10 :05am/LMB
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
## 2011-07-15 NOTE — Patient Instructions (Addendum)
It was good to see you today. We have reviewed your prior records including labs and tests today Medications reviewed, no changes at this time. Use Analpram cream as needed for hemorrhoid pain Refill on medication(s) as discussed today. Please schedule followup in 6 months for physical/labs, call sooner if problems.

## 2011-07-15 NOTE — Assessment & Plan Note (Signed)
Chronic, intermittent symptoms  Currently in "good spell" Continue same treatment as ongoing
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
## 2011-10-09 ENCOUNTER — Telehealth: Payer: Self-pay | Admitting: Internal Medicine

## 2011-10-09 NOTE — Telephone Encounter (Signed)
  Caller: Brent Sandoval/Patient; PCP: Rene Paci; CB#: [phone];  Call regarding Sore Throat  that started 10/07/11 and now has a cough that is getting worse.  He wants  Phenergan with Codiene call in.  Afebrile today.  98.9.   Triaged Cough/Adult and cough is better when he lies down and worse when he sits up. Marland Kitchen  Disp = needs to be  seen in 24 hrs.   Appt offered for this afternoon,  but pt cannot take this. Wanted appt for 10/12/11 but inst not scheduling out that far for call in appt yet.  Home care and call back inst given.  Voices understanding.

## 2011-10-09 NOTE — Telephone Encounter (Signed)
Ok to sched future appt as needed

## 2011-11-23 ENCOUNTER — Ambulatory Visit (INDEPENDENT_AMBULATORY_CARE_PROVIDER_SITE_OTHER): Payer: BC Managed Care – PPO | Admitting: Internal Medicine

## 2011-11-23 ENCOUNTER — Encounter: Payer: Self-pay | Admitting: Internal Medicine

## 2011-11-23 VITALS — BP 118/72 | HR 76 | Temp 98.4°F | Ht 63.5 in | Wt 144.1 lb

## 2011-11-23 DIAGNOSIS — R42 Dizziness and giddiness: Secondary | ICD-10-CM

## 2011-11-23 DIAGNOSIS — G43909 Migraine, unspecified, not intractable, without status migrainosus: Secondary | ICD-10-CM

## 2011-11-23 DIAGNOSIS — R569 Unspecified convulsions: Secondary | ICD-10-CM

## 2011-11-23 MED ORDER — DIAZEPAM 5 MG PO TABS: 5.0000 mg | ORAL_TABLET | Freq: Three times a day (TID) | ORAL | Status: DC | PRN

## 2011-11-23 MED ORDER — ZOLPIDEM TARTRATE 10 MG PO TABS: 5.0000 mg | ORAL_TABLET | Freq: Every evening | ORAL | Status: AC | PRN

## 2011-11-23 NOTE — Assessment & Plan Note (Signed)
Chronic events without change in nature of symptoms - 1-2 episodes per week, controls same with percocet prn Refills provided - follow up neuro prn 

## 2011-11-23 NOTE — Assessment & Plan Note (Signed)
Intermittent, severe symptoms - no help with vesticular rehab or ENT eval 2011-  Ongoing chiropractic therapy at [facility] Most meds ineffective (meclizine, phenergan, valium) Continue vestibular therapy as tolerated Refill on meds today Also check MRI brain - last done early 2000s, note hx melanoma x 2, last in 1998
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
## 2011-11-23 NOTE — Patient Instructions (Signed)
It was good to see you today. we'll make referral for MRI brain due to dizzy symptoms, migraine and history of melanoma. Our office will contact you regarding appointment(s) once made. Ok to use Ambein as needed for sleep - but try amitriptyline or Benadryl 1st Medications reviewed, no changes at this time. Refill on medication(s) as discussed today.
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
## 2011-12-03 ENCOUNTER — Ambulatory Visit
Admission: RE | Admit: 2011-12-03 | Discharge: 2011-12-03 | Disposition: A | Discharge status: Home / Self Care | Payer: BC Managed Care – PPO | Source: Ambulatory Visit | Attending: Internal Medicine | Admitting: Internal Medicine

## 2011-12-03 DIAGNOSIS — R569 Unspecified convulsions: Secondary | ICD-10-CM

## 2011-12-03 DIAGNOSIS — G43909 Migraine, unspecified, not intractable, without status migrainosus: Secondary | ICD-10-CM

## 2011-12-03 DIAGNOSIS — R42 Dizziness and giddiness: Secondary | ICD-10-CM

## 2012-01-13 ENCOUNTER — Ambulatory Visit (INDEPENDENT_AMBULATORY_CARE_PROVIDER_SITE_OTHER): Payer: BC Managed Care – PPO | Admitting: Internal Medicine

## 2012-01-13 ENCOUNTER — Encounter: Payer: Self-pay | Admitting: Internal Medicine

## 2012-01-13 ENCOUNTER — Other Ambulatory Visit (INDEPENDENT_AMBULATORY_CARE_PROVIDER_SITE_OTHER): Payer: BC Managed Care – PPO

## 2012-01-13 VITALS — BP 102/68 | HR 56 | Temp 97.7°F | Ht 64.5 in | Wt 142.0 lb

## 2012-01-13 DIAGNOSIS — Z23 Encounter for immunization: Secondary | ICD-10-CM

## 2012-01-13 DIAGNOSIS — Z0000 Encounter for general adult medical examination without abnormal findings: Secondary | ICD-10-CM

## 2012-01-13 DIAGNOSIS — R42 Dizziness and giddiness: Secondary | ICD-10-CM

## 2012-01-13 DIAGNOSIS — Z125 Encounter for screening for malignant neoplasm of prostate: Secondary | ICD-10-CM

## 2012-01-13 DIAGNOSIS — N400 Enlarged prostate without lower urinary tract symptoms: Secondary | ICD-10-CM | POA: Insufficient documentation

## 2012-01-13 LAB — URINALYSIS, ROUTINE W REFLEX MICROSCOPIC
Bilirubin Urine: NEGATIVE
Leukocytes, UA: NEGATIVE
Nitrite: NEGATIVE
Specific Gravity, Urine: <=1.005 (ref 1.000–1.030)
pH: 7 (ref 5.0–8.0)

## 2012-01-13 LAB — LIPID PANEL
Cholesterol: 197 mg/dL (ref 0–200)
HDL: 45.9 mg/dL (ref >39.00)
Triglycerides: 114 mg/dL (ref 0.0–149.0)
VLDL: 22.8 mg/dL (ref 0.0–40.0)

## 2012-01-13 LAB — CBC WITH DIFFERENTIAL/PLATELET
Basophils Absolute: 0.1 10*3/uL (ref 0.0–0.1)
Lymphocytes Relative: 37.1 % (ref 12.0–46.0)
Lymphs Abs: 4 10*3/uL (ref 0.7–4.0)
Monocytes Relative: 9.8 % (ref 3.0–12.0)
Neutrophils Relative %: 51.4 % (ref 43.0–77.0)
Platelets: 291 10*3/uL (ref 150.0–400.0)
RDW: 14.1 % (ref 11.5–14.6)

## 2012-01-13 LAB — HEPATIC FUNCTION PANEL
ALT: 29 U/L (ref 0–53)
AST: 19 U/L (ref 0–37)
Albumin: 4.4 g/dL (ref 3.5–5.2)
Alkaline Phosphatase: 55 U/L (ref 39–117)
Total Protein: 7 g/dL (ref 6.0–8.3)

## 2012-01-13 LAB — PSA: PSA: 1.47 ng/mL (ref 0.10–4.00)

## 2012-01-13 LAB — BASIC METABOLIC PANEL
CO2: 31 mEq/L (ref 19–32)
Glucose, Bld: 90 mg/dL (ref 70–99)
Potassium: 4.8 mEq/L (ref 3.5–5.1)
Sodium: 139 mEq/L (ref 135–145)

## 2012-01-13 LAB — TSH: TSH: 2.19 u[IU]/mL (ref 0.35–5.50)

## 2012-01-13 MED ORDER — TAMSULOSIN HCL 0.4 MG PO CAPS: 0.4000 mg | ORAL_CAPSULE | Freq: Every day | ORAL | Status: DC

## 2012-01-13 NOTE — Patient Instructions (Signed)
It was good to see you today. We have reviewed your prior records including labs and tests today Health Maintenance reviewed - flu shot given today  -all recommended immunizations and age-appropriate screenings are up-to-date. Test(s) ordered today. Your results will be released to MyChart (or called to you) after review, usually within 72hours after test completion. If any changes need to be made, you will be notified at that same time. Medications reviewed and updated: Okay to use Flomax as needed for narcotic induced urinary retention, no other changes at this time. Your prescription(s) have been submitted to your pharmacy. Please take as directed and contact our office if you believe you are having problem(s) with the medication(s). Please schedule followup in 6-12 months, call sooner if problems.    Health Maintenance, Males A healthy lifestyle and preventative care can promote health and wellness.  Maintain regular health, dental, and eye exams.   Eat a healthy diet. Foods like vegetables, fruits, whole grains, low-fat dairy products, and lean protein foods contain the nutrients you need without too many calories. Decrease your intake of foods high in solid fats, added sugars, and salt. Get information about a proper diet from your caregiver, if necessary.   Regular physical exercise is one of the most important things you can do for your health. Most adults should get at least 150 minutes of moderate-intensity exercise (any activity that increases your heart rate and causes you to sweat) each week. In addition, most adults need muscle-strengthening exercises on 2 or more days a week.     Maintain a healthy weight. The body mass index (BMI) is a screening tool to identify possible weight problems. It provides an estimate of body fat based on height and weight. Your caregiver can help determine your BMI, and can help you achieve or maintain a healthy weight. For adults 20 years and older:    A BMI below 18.5 is considered underweight.   A BMI of 18.5 to 24.9 is normal.   A BMI of 25 to 29.9 is considered overweight.   A BMI of 30 and above is considered obese.   Maintain normal blood lipids and cholesterol by exercising and minimizing your intake of saturated fat. Eat a balanced diet with plenty of fruits and vegetables. Blood tests for lipids and cholesterol should begin at age 63 and be repeated every 5 years. If your lipid or cholesterol levels are high, you are over 50, or you are a high risk for heart disease, you may need your cholesterol levels checked more frequently. Ongoing high lipid and cholesterol levels should be treated with medicines, if diet and exercise are not effective.   If you smoke, find out from your caregiver how to quit. If you do not use tobacco, do not start.   If you choose to drink alcohol, do not exceed 2 drinks per day. One drink is considered to be 12 ounces (355 mL) of beer, 5 ounces (148 mL) of wine, or 1.5 ounces (44 mL) of liquor.   Avoid use of street drugs. Do not share needles with anyone. Ask for help if you need support or instructions about stopping the use of drugs.   High blood pressure causes heart disease and increases the risk of stroke. Blood pressure should be checked at least every 1 to 2 years. Ongoing high blood pressure should be treated with medicines if weight loss and exercise are not effective.   If you are 59 to 57 years old, ask your caregiver  if you should take aspirin to prevent heart disease.   Diabetes screening involves taking a blood sample to check your fasting blood sugar level. This should be done once every 3 years, after age 60, if you are within normal weight and without risk factors for diabetes. Testing should be considered at a younger age or be carried out more frequently if you are overweight and have at least 1 risk factor for diabetes.   Colorectal cancer can be detected and often prevented. Most  routine colorectal cancer screening begins at the age of 52 and continues through age 67. However, your caregiver may recommend screening at an earlier age if you have risk factors for colon cancer. On a yearly basis, your caregiver may provide home test kits to check for hidden blood in the stool. Use of a small camera at the end of a tube, to directly examine the colon (sigmoidoscopy or colonoscopy), can detect the earliest forms of colorectal cancer. Talk to your caregiver about this at age 42, when routine screening begins. Direct examination of the colon should be repeated every 5 to 10 years through age 98, unless early forms of pre-cancerous polyps or small growths are found.   Hepatitis C blood testing is recommended for all people born from [birth year] through 1966 and any individual with known risks for hepatitis C.   Healthy men should no longer receive prostate-specific antigen (PSA) blood tests as part of routine cancer screening. Consult with your caregiver about prostate cancer screening.   Testicular cancer screening is not recommended for adolescents or adult males who have no symptoms. Screening includes self-exam, caregiver exam, and other screening tests. Consult with your caregiver about any symptoms you have or any concerns you have about testicular cancer.   Practice safe sex. Use condoms and avoid high-risk sexual practices to reduce the spread of sexually transmitted infections (STIs).   Use sunscreen with a sun protection factor (SPF) of 30 or greater. Apply sunscreen liberally and repeatedly throughout the day. You should seek shade when your shadow is shorter than you. Protect yourself by wearing long sleeves, pants, a wide-brimmed hat, and sunglasses year round, whenever you are outdoors.   Notify your caregiver of new moles or changes in moles, especially if there is a change in shape or color. Also notify your caregiver if a mole is larger than the size of a pencil eraser.   A  one-time screening for abdominal aortic aneurysm (AAA) and surgical repair of large AAAs by sound wave imaging (ultrasonography) is recommended for ages 31 to 51 years who are current or former smokers.   Stay current with your immunizations.  Document Released: 09/26/2007 Document Revised: 06/22/2011 Document Reviewed: 08/25/2010 [facility] Patient Information 2013 [place], [place].
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
## 2012-01-13 NOTE — Assessment & Plan Note (Signed)
Intermittent, severe symptoms - no help with vesticular rehab or ENT eval 2011-  Ongoing chiropractic therapy at [facility] Most meds ineffective (meclizine, phenergan, valium) Continue vestibular therapy as tolerated 11/2011 MRI brain unremarkable (note hx melanoma x 2, last in 1998)

## 2012-01-26 ENCOUNTER — Telehealth: Payer: Self-pay | Admitting: *Deleted

## 2012-01-26 DIAGNOSIS — Z0000 Encounter for general adult medical examination without abnormal findings: Secondary | ICD-10-CM

## 2012-01-26 DIAGNOSIS — Z0389 Encounter for observation for other suspected diseases and conditions ruled out: Secondary | ICD-10-CM

## 2012-01-26 NOTE — Telephone Encounter (Signed)
Message copied by Carin Primrose on Tue Jan 26, 2012  8:26 AM ------      Message from: COUSIN, SHARON T      Created: Wed Jan 13, 2012  8:56 AM      Regarding: PHY DATE  01/13/13       THANKS

## 2012-01-26 NOTE — Telephone Encounter (Signed)
CPX labs placed into Epic for upcoming appointment. 

## 2012-05-24 ENCOUNTER — Other Ambulatory Visit: Payer: Self-pay | Admitting: *Deleted

## 2012-05-24 MED ORDER — DIAZEPAM 5 MG PO TABS: 5.0000 mg | ORAL_TABLET | Freq: Three times a day (TID) | ORAL | Status: DC | PRN

## 2012-05-25 NOTE — Telephone Encounter (Signed)
[facility].../lmb 

## 2012-11-14 ENCOUNTER — Other Ambulatory Visit: Payer: Self-pay | Admitting: *Deleted

## 2012-11-14 MED ORDER — DIAZEPAM 5 MG PO TABS: 5.0000 mg | ORAL_TABLET | Freq: Three times a day (TID) | ORAL | Status: AC | PRN

## 2012-11-14 NOTE — Telephone Encounter (Signed)
Md out of office pls advise.../lmb 

## 2012-11-14 NOTE — Telephone Encounter (Signed)
[facility].../lmb 

## 2013-01-13 ENCOUNTER — Encounter: Payer: BC Managed Care – PPO | Admitting: Internal Medicine

## 2013-01-16 ENCOUNTER — Other Ambulatory Visit (INDEPENDENT_AMBULATORY_CARE_PROVIDER_SITE_OTHER): Payer: BC Managed Care – PPO

## 2013-01-16 ENCOUNTER — Encounter: Payer: Self-pay | Admitting: Internal Medicine

## 2013-01-16 ENCOUNTER — Ambulatory Visit (INDEPENDENT_AMBULATORY_CARE_PROVIDER_SITE_OTHER): Payer: BC Managed Care – PPO | Admitting: Internal Medicine

## 2013-01-16 VITALS — BP 110/74 | HR 59 | Temp 97.4°F | Ht 64.5 in | Wt 132.8 lb

## 2013-01-16 DIAGNOSIS — R443 Hallucinations, unspecified: Secondary | ICD-10-CM

## 2013-01-16 DIAGNOSIS — R42 Dizziness and giddiness: Secondary | ICD-10-CM

## 2013-01-16 DIAGNOSIS — K589 Irritable bowel syndrome without diarrhea: Secondary | ICD-10-CM

## 2013-01-16 DIAGNOSIS — Z0000 Encounter for general adult medical examination without abnormal findings: Secondary | ICD-10-CM

## 2013-01-16 DIAGNOSIS — Z0389 Encounter for observation for other suspected diseases and conditions ruled out: Secondary | ICD-10-CM

## 2013-01-16 DIAGNOSIS — Z23 Encounter for immunization: Secondary | ICD-10-CM

## 2013-01-16 DIAGNOSIS — F4310 Post-traumatic stress disorder, unspecified: Secondary | ICD-10-CM

## 2013-01-16 LAB — CBC WITH DIFFERENTIAL/PLATELET
Basophils Relative: 0.7 % (ref 0.0–3.0)
Eosinophils Absolute: 0.2 10*3/uL (ref 0.0–0.7)
Eosinophils Relative: 1.7 % (ref 0.0–5.0)
HCT: 48.5 % (ref 39.0–52.0)
Hemoglobin: 17.1 g/dL — ABNORMAL HIGH (ref 13.0–17.0)
MCHC: 35.3 g/dL (ref 30.0–36.0)
MCV: 91.6 fl (ref 78.0–100.0)
Monocytes Absolute: 1.1 10*3/uL — ABNORMAL HIGH (ref 0.1–1.0)
Neutro Abs: 5.9 10*3/uL (ref 1.4–7.7)
RBC: 5.29 Mil/uL (ref 4.22–5.81)
WBC: 11.2 10*3/uL — ABNORMAL HIGH (ref 4.5–10.5)

## 2013-01-16 LAB — URINALYSIS, ROUTINE W REFLEX MICROSCOPIC
Bilirubin Urine: NEGATIVE
Hgb urine dipstick: NEGATIVE
Leukocytes, UA: NEGATIVE
Nitrite: NEGATIVE
Total Protein, Urine: NEGATIVE
WBC, UA: NONE SEEN (ref 0–?)
pH: 6 (ref 5.0–8.0)

## 2013-01-16 LAB — PSA: PSA: 1.67 ng/mL (ref 0.10–4.00)

## 2013-01-16 LAB — BASIC METABOLIC PANEL
BUN: 12 mg/dL (ref 6–23)
Creatinine, Ser: 1.1 mg/dL (ref 0.4–1.5)
GFR: 74.53 mL/min (ref >60.00)
Glucose, Bld: 101 mg/dL — ABNORMAL HIGH (ref 70–99)
Potassium: 3.9 mEq/L (ref 3.5–5.1)

## 2013-01-16 LAB — HEPATIC FUNCTION PANEL
ALT: 26 U/L (ref 0–53)
AST: 21 U/L (ref 0–37)
Albumin: 4.4 g/dL (ref 3.5–5.2)
Total Bilirubin: 0.7 mg/dL (ref 0.3–1.2)

## 2013-01-16 LAB — LIPID PANEL
LDL Cholesterol: 104 mg/dL — ABNORMAL HIGH (ref 0–99)
Total CHOL/HDL Ratio: 4

## 2013-01-16 NOTE — Patient Instructions (Addendum)
It was good to see you today. Your annual flu shot was given and/or updated today. Health Maintenance reviewed - all recommended immunizations and age-appropriate screenings are up-to-date. We have reviewed your prior records including labs and tests today Medications reviewed and updated, no changes recommended at this time. Test(s) ordered today. Your results will be released to MyChart (or called to you) after review, usually within 72hours after test completion. If any changes need to be made, you will be notified at that same time. Refill on medication(s) as discussed today. Please schedule followup in 12 months for annual wellness visit and labs, call sooner if problems.  Health Maintenance, Males A healthy lifestyle and preventative care can promote health and wellness.  Maintain regular health, dental, and eye exams.  Eat a healthy diet. Foods like vegetables, fruits, whole grains, low-fat dairy products, and lean protein foods contain the nutrients you need without too many calories. Decrease your intake of foods high in solid fats, added sugars, and salt. Get information about a proper diet from your caregiver, if necessary.  Regular physical exercise is one of the most important things you can do for your health. Most adults should get at least 150 minutes of moderate-intensity exercise (any activity that increases your heart rate and causes you to sweat) each week. In addition, most adults need muscle-strengthening exercises on 2 or more days a week.   Maintain a healthy weight. The body mass index (BMI) is a screening tool to identify possible weight problems. It provides an estimate of body fat based on height and weight. Your caregiver can help determine your BMI, and can help you achieve or maintain a healthy weight. For adults 20 years and older:  A BMI below 18.5 is considered underweight.  A BMI of 18.5 to 24.9 is normal.  A BMI of 25 to 29.9 is considered overweight.  A  BMI of 30 and above is considered obese.  Maintain normal blood lipids and cholesterol by exercising and minimizing your intake of saturated fat. Eat a balanced diet with plenty of fruits and vegetables. Blood tests for lipids and cholesterol should begin at age 19 and be repeated every 5 years. If your lipid or cholesterol levels are high, you are over 50, or you are a high risk for heart disease, you may need your cholesterol levels checked more frequently.Ongoing high lipid and cholesterol levels should be treated with medicines, if diet and exercise are not effective.  If you smoke, find out from your caregiver how to quit. If you do not use tobacco, do not start.  If you choose to drink alcohol, do not exceed 2 drinks per day. One drink is considered to be 12 ounces (355 mL) of beer, 5 ounces (148 mL) of wine, or 1.5 ounces (44 mL) of liquor.  Avoid use of street drugs. Do not share needles with anyone. Ask for help if you need support or instructions about stopping the use of drugs.  High blood pressure causes heart disease and increases the risk of stroke. Blood pressure should be checked at least every 1 to 2 years. Ongoing high blood pressure should be treated with medicines if weight loss and exercise are not effective.  If you are 51 to 58 years old, ask your caregiver if you should take aspirin to prevent heart disease.  Diabetes screening involves taking a blood sample to check your fasting blood sugar level. This should be done once every 3 years, after age 30, if you are  within normal weight and without risk factors for diabetes. Testing should be considered at a younger age or be carried out more frequently if you are overweight and have at least 1 risk factor for diabetes.  Colorectal cancer can be detected and often prevented. Most routine colorectal cancer screening begins at the age of 76 and continues through age 62. However, your caregiver may recommend screening at an earlier  age if you have risk factors for colon cancer. On a yearly basis, your caregiver may provide home test kits to check for hidden blood in the stool. Use of a small camera at the end of a tube, to directly examine the colon (sigmoidoscopy or colonoscopy), can detect the earliest forms of colorectal cancer. Talk to your caregiver about this at age 72, when routine screening begins. Direct examination of the colon should be repeated every 5 to 10 years through age 63, unless early forms of pre-cancerous polyps or small growths are found.  Hepatitis C blood testing is recommended for all people born from [birth year] through 1966 and any individual with known risks for hepatitis C.  Healthy men should no longer receive prostate-specific antigen (PSA) blood tests as part of routine cancer screening. Consult with your caregiver about prostate cancer screening.  Testicular cancer screening is not recommended for adolescents or adult males who have no symptoms. Screening includes self-exam, caregiver exam, and other screening tests. Consult with your caregiver about any symptoms you have or any concerns you have about testicular cancer.  Practice safe sex. Use condoms and avoid high-risk sexual practices to reduce the spread of sexually transmitted infections (STIs).  Use sunscreen with a sun protection factor (SPF) of 30 or greater. Apply sunscreen liberally and repeatedly throughout the day. You should seek shade when your shadow is shorter than you. Protect yourself by wearing long sleeves, pants, a wide-brimmed hat, and sunglasses year round, whenever you are outdoors.  Notify your caregiver of new moles or changes in moles, especially if there is a change in shape or color. Also notify your caregiver if a mole is larger than the size of a pencil eraser.  A one-time screening for abdominal aortic aneurysm (AAA) and surgical repair of large AAAs by sound wave imaging (ultrasonography) is recommended for ages 30 to  25 years who are current or former smokers.  Stay current with your immunizations. Document Released: 09/26/2007 Document Revised: 06/22/2011 Document Reviewed: 08/25/2010 [facility] Patient Information 2014 [place], [place].

## 2013-01-16 NOTE — Assessment & Plan Note (Addendum)
Intermittent, severe symptoms - no help with vesticular rehab or ENT eval 2011-  Ongoing chiropractic therapy at [facility] Continue vestibular therapy as tolerated 11/2011 MRI brain unremarkable (note hx melanoma x 2, last in 1998) Valium prn controlling symptoms
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
## 2014-01-17 ENCOUNTER — Encounter: Payer: BC Managed Care – PPO | Admitting: Internal Medicine

## 2014-02-02 ENCOUNTER — Telehealth: Payer: Self-pay | Admitting: Internal Medicine

## 2014-02-02 NOTE — Telephone Encounter (Signed)
Pt called in requesting for someone to call him in cough meds. Told him he may have to have an appt but he wanted me to see anyway

## 2014-02-02 NOTE — Telephone Encounter (Signed)
Pt need appt to be evaluated. Last saw md 01/2013. Can come to [facility] tomorrow...Brent Sandoval

## 2015-03-27 ENCOUNTER — Encounter: Payer: Self-pay | Admitting: Gastroenterology

## 2015-07-03 DIAGNOSIS — Z6825 Body mass index (BMI) 25.0-25.9, adult: Secondary | ICD-10-CM | POA: Diagnosis not present

## 2015-07-03 DIAGNOSIS — Z0000 Encounter for general adult medical examination without abnormal findings: Secondary | ICD-10-CM | POA: Diagnosis not present

## 2015-07-15 LAB — FECAL OCCULT BLOOD, GUAIAC: FECAL OCCULT BLD: NEGATIVE

## 2015-09-17 ENCOUNTER — Encounter: Payer: Self-pay | Admitting: Internal Medicine

## 2016-04-27 ENCOUNTER — Other Ambulatory Visit: Payer: Self-pay | Admitting: Family Medicine

## 2016-04-27 DIAGNOSIS — Z6821 Body mass index (BMI) 21.0-21.9, adult: Secondary | ICD-10-CM | POA: Diagnosis not present

## 2016-04-27 DIAGNOSIS — K219 Gastro-esophageal reflux disease without esophagitis: Secondary | ICD-10-CM | POA: Diagnosis not present

## 2016-04-27 DIAGNOSIS — M48061 Spinal stenosis, lumbar region without neurogenic claudication: Secondary | ICD-10-CM

## 2016-04-27 DIAGNOSIS — G43909 Migraine, unspecified, not intractable, without status migrainosus: Secondary | ICD-10-CM | POA: Diagnosis not present

## 2016-04-27 DIAGNOSIS — M48062 Spinal stenosis, lumbar region with neurogenic claudication: Secondary | ICD-10-CM | POA: Diagnosis not present

## 2016-04-27 DIAGNOSIS — R109 Unspecified abdominal pain: Secondary | ICD-10-CM | POA: Diagnosis not present

## 2016-04-30 DIAGNOSIS — G20 Parkinson's disease: Secondary | ICD-10-CM | POA: Diagnosis not present

## 2016-05-04 DIAGNOSIS — T754XXS Electrocution, sequela: Secondary | ICD-10-CM | POA: Diagnosis not present

## 2016-05-04 DIAGNOSIS — M6281 Muscle weakness (generalized): Secondary | ICD-10-CM | POA: Diagnosis not present

## 2016-05-04 DIAGNOSIS — Z9889 Other specified postprocedural states: Secondary | ICD-10-CM | POA: Diagnosis not present

## 2016-05-04 DIAGNOSIS — M4800 Spinal stenosis, site unspecified: Secondary | ICD-10-CM | POA: Diagnosis not present

## 2016-05-04 DIAGNOSIS — H509 Unspecified strabismus: Secondary | ICD-10-CM | POA: Diagnosis not present

## 2016-05-04 DIAGNOSIS — Z85820 Personal history of malignant melanoma of skin: Secondary | ICD-10-CM | POA: Diagnosis not present

## 2016-05-04 DIAGNOSIS — G936 Cerebral edema: Secondary | ICD-10-CM | POA: Diagnosis not present

## 2016-05-04 DIAGNOSIS — C799 Secondary malignant neoplasm of unspecified site: Secondary | ICD-10-CM | POA: Diagnosis not present

## 2016-05-04 DIAGNOSIS — Z90411 Acquired partial absence of pancreas: Secondary | ICD-10-CM | POA: Diagnosis not present

## 2016-05-04 DIAGNOSIS — R918 Other nonspecific abnormal finding of lung field: Secondary | ICD-10-CM | POA: Diagnosis not present

## 2016-05-04 DIAGNOSIS — Z79899 Other long term (current) drug therapy: Secondary | ICD-10-CM | POA: Diagnosis not present

## 2016-05-04 DIAGNOSIS — R569 Unspecified convulsions: Secondary | ICD-10-CM | POA: Diagnosis not present

## 2016-05-04 DIAGNOSIS — R531 Weakness: Secondary | ICD-10-CM | POA: Diagnosis not present

## 2016-05-04 DIAGNOSIS — D7289 Other specified disorders of white blood cells: Secondary | ICD-10-CM | POA: Diagnosis not present

## 2016-05-04 DIAGNOSIS — G939 Disorder of brain, unspecified: Secondary | ICD-10-CM | POA: Diagnosis not present

## 2016-05-04 DIAGNOSIS — R29810 Facial weakness: Secondary | ICD-10-CM | POA: Diagnosis not present

## 2016-05-04 DIAGNOSIS — G9389 Other specified disorders of brain: Secondary | ICD-10-CM | POA: Diagnosis not present

## 2016-05-04 DIAGNOSIS — R609 Edema, unspecified: Secondary | ICD-10-CM | POA: Diagnosis not present

## 2016-05-04 DIAGNOSIS — C439 Malignant melanoma of skin, unspecified: Secondary | ICD-10-CM | POA: Diagnosis not present

## 2016-05-04 DIAGNOSIS — C7931 Secondary malignant neoplasm of brain: Secondary | ICD-10-CM | POA: Diagnosis not present

## 2016-05-04 DIAGNOSIS — Z9081 Acquired absence of spleen: Secondary | ICD-10-CM | POA: Diagnosis not present

## 2016-05-04 DIAGNOSIS — R29898 Other symptoms and signs involving the musculoskeletal system: Secondary | ICD-10-CM | POA: Diagnosis not present

## 2016-05-04 DIAGNOSIS — K219 Gastro-esophageal reflux disease without esophagitis: Secondary | ICD-10-CM | POA: Diagnosis not present

## 2016-05-04 DIAGNOSIS — R220 Localized swelling, mass and lump, head: Secondary | ICD-10-CM | POA: Diagnosis not present

## 2016-05-04 DIAGNOSIS — R110 Nausea: Secondary | ICD-10-CM | POA: Diagnosis not present

## 2016-05-05 ENCOUNTER — Other Ambulatory Visit: Payer: Medicare Other

## 2016-05-13 DIAGNOSIS — C7800 Secondary malignant neoplasm of unspecified lung: Secondary | ICD-10-CM | POA: Diagnosis not present

## 2016-05-13 DIAGNOSIS — C7931 Secondary malignant neoplasm of brain: Secondary | ICD-10-CM | POA: Diagnosis not present

## 2016-05-13 DIAGNOSIS — C434 Malignant melanoma of scalp and neck: Secondary | ICD-10-CM | POA: Diagnosis not present

## 2016-05-13 DIAGNOSIS — C799 Secondary malignant neoplasm of unspecified site: Secondary | ICD-10-CM | POA: Diagnosis not present

## 2016-05-19 DIAGNOSIS — F4310 Post-traumatic stress disorder, unspecified: Secondary | ICD-10-CM | POA: Diagnosis not present

## 2016-05-19 DIAGNOSIS — R112 Nausea with vomiting, unspecified: Secondary | ICD-10-CM | POA: Diagnosis not present

## 2016-05-19 DIAGNOSIS — R5383 Other fatigue: Secondary | ICD-10-CM | POA: Diagnosis not present

## 2016-05-19 DIAGNOSIS — Z9889 Other specified postprocedural states: Secondary | ICD-10-CM | POA: Diagnosis not present

## 2016-05-19 DIAGNOSIS — R51 Headache: Secondary | ICD-10-CM | POA: Diagnosis not present

## 2016-05-19 DIAGNOSIS — Z85820 Personal history of malignant melanoma of skin: Secondary | ICD-10-CM | POA: Diagnosis not present

## 2016-05-19 DIAGNOSIS — G8220 Paraplegia, unspecified: Secondary | ICD-10-CM | POA: Diagnosis not present

## 2016-05-19 DIAGNOSIS — C439 Malignant melanoma of skin, unspecified: Secondary | ICD-10-CM | POA: Diagnosis not present

## 2016-05-19 DIAGNOSIS — C799 Secondary malignant neoplasm of unspecified site: Secondary | ICD-10-CM | POA: Diagnosis not present

## 2016-05-19 DIAGNOSIS — C7931 Secondary malignant neoplasm of brain: Secondary | ICD-10-CM | POA: Diagnosis not present

## 2016-05-27 DIAGNOSIS — C7931 Secondary malignant neoplasm of brain: Secondary | ICD-10-CM | POA: Diagnosis not present

## 2016-05-27 DIAGNOSIS — Z5112 Encounter for antineoplastic immunotherapy: Secondary | ICD-10-CM | POA: Diagnosis not present

## 2016-05-27 DIAGNOSIS — C434 Malignant melanoma of scalp and neck: Secondary | ICD-10-CM | POA: Diagnosis not present

## 2016-05-27 DIAGNOSIS — C799 Secondary malignant neoplasm of unspecified site: Secondary | ICD-10-CM | POA: Diagnosis not present

## 2016-05-27 DIAGNOSIS — C7989 Secondary malignant neoplasm of other specified sites: Secondary | ICD-10-CM | POA: Diagnosis not present

## 2016-05-27 DIAGNOSIS — C7800 Secondary malignant neoplasm of unspecified lung: Secondary | ICD-10-CM | POA: Diagnosis not present

## 2016-06-04 DIAGNOSIS — Z510 Encounter for antineoplastic radiation therapy: Secondary | ICD-10-CM | POA: Diagnosis not present

## 2016-06-04 DIAGNOSIS — C439 Malignant melanoma of skin, unspecified: Secondary | ICD-10-CM | POA: Diagnosis not present

## 2016-06-04 DIAGNOSIS — C7931 Secondary malignant neoplasm of brain: Secondary | ICD-10-CM | POA: Diagnosis not present

## 2016-06-04 DIAGNOSIS — Z923 Personal history of irradiation: Secondary | ICD-10-CM | POA: Diagnosis not present

## 2016-06-17 DIAGNOSIS — Z5111 Encounter for antineoplastic chemotherapy: Secondary | ICD-10-CM | POA: Diagnosis not present

## 2016-06-17 DIAGNOSIS — C7931 Secondary malignant neoplasm of brain: Secondary | ICD-10-CM | POA: Diagnosis not present

## 2016-06-17 DIAGNOSIS — C7800 Secondary malignant neoplasm of unspecified lung: Secondary | ICD-10-CM | POA: Diagnosis not present

## 2016-06-17 DIAGNOSIS — C434 Malignant melanoma of scalp and neck: Secondary | ICD-10-CM | POA: Diagnosis not present

## 2016-06-17 DIAGNOSIS — Z9889 Other specified postprocedural states: Secondary | ICD-10-CM | POA: Diagnosis not present

## 2016-07-08 DIAGNOSIS — C7800 Secondary malignant neoplasm of unspecified lung: Secondary | ICD-10-CM | POA: Diagnosis not present

## 2016-07-08 DIAGNOSIS — T50905A Adverse effect of unspecified drugs, medicaments and biological substances, initial encounter: Secondary | ICD-10-CM | POA: Diagnosis not present

## 2016-07-08 DIAGNOSIS — C434 Malignant melanoma of scalp and neck: Secondary | ICD-10-CM | POA: Diagnosis not present

## 2016-07-08 DIAGNOSIS — L298 Other pruritus: Secondary | ICD-10-CM | POA: Diagnosis not present

## 2016-07-08 DIAGNOSIS — C7931 Secondary malignant neoplasm of brain: Secondary | ICD-10-CM | POA: Diagnosis not present

## 2016-07-08 DIAGNOSIS — C799 Secondary malignant neoplasm of unspecified site: Secondary | ICD-10-CM | POA: Diagnosis not present

## 2016-07-08 DIAGNOSIS — Z79899 Other long term (current) drug therapy: Secondary | ICD-10-CM | POA: Diagnosis not present

## 2016-07-29 DIAGNOSIS — C799 Secondary malignant neoplasm of unspecified site: Secondary | ICD-10-CM | POA: Diagnosis not present

## 2016-07-29 DIAGNOSIS — L270 Generalized skin eruption due to drugs and medicaments taken internally: Secondary | ICD-10-CM | POA: Diagnosis not present

## 2016-07-29 DIAGNOSIS — L299 Pruritus, unspecified: Secondary | ICD-10-CM | POA: Diagnosis not present

## 2016-07-29 DIAGNOSIS — C434 Malignant melanoma of scalp and neck: Secondary | ICD-10-CM | POA: Diagnosis not present

## 2016-07-29 DIAGNOSIS — Z5112 Encounter for antineoplastic immunotherapy: Secondary | ICD-10-CM | POA: Diagnosis not present

## 2016-07-29 DIAGNOSIS — C7931 Secondary malignant neoplasm of brain: Secondary | ICD-10-CM | POA: Diagnosis not present

## 2016-07-29 DIAGNOSIS — C7800 Secondary malignant neoplasm of unspecified lung: Secondary | ICD-10-CM | POA: Diagnosis not present

## 2016-07-29 DIAGNOSIS — Z9889 Other specified postprocedural states: Secondary | ICD-10-CM | POA: Diagnosis not present

## 2016-07-29 DIAGNOSIS — C4339 Malignant melanoma of other parts of face: Secondary | ICD-10-CM | POA: Diagnosis not present

## 2016-08-11 DIAGNOSIS — Z85820 Personal history of malignant melanoma of skin: Secondary | ICD-10-CM | POA: Diagnosis not present

## 2016-08-11 DIAGNOSIS — Z08 Encounter for follow-up examination after completed treatment for malignant neoplasm: Secondary | ICD-10-CM | POA: Diagnosis not present

## 2016-08-11 DIAGNOSIS — Z85841 Personal history of malignant neoplasm of brain: Secondary | ICD-10-CM | POA: Diagnosis not present

## 2016-08-11 DIAGNOSIS — C7931 Secondary malignant neoplasm of brain: Secondary | ICD-10-CM | POA: Diagnosis not present

## 2016-08-19 DIAGNOSIS — C4339 Malignant melanoma of other parts of face: Secondary | ICD-10-CM | POA: Diagnosis not present

## 2016-08-19 DIAGNOSIS — C434 Malignant melanoma of scalp and neck: Secondary | ICD-10-CM | POA: Diagnosis not present

## 2016-08-19 DIAGNOSIS — L299 Pruritus, unspecified: Secondary | ICD-10-CM | POA: Diagnosis not present

## 2016-08-19 DIAGNOSIS — L270 Generalized skin eruption due to drugs and medicaments taken internally: Secondary | ICD-10-CM | POA: Diagnosis not present

## 2016-08-19 DIAGNOSIS — C7802 Secondary malignant neoplasm of left lung: Secondary | ICD-10-CM | POA: Diagnosis not present

## 2016-08-19 DIAGNOSIS — C7931 Secondary malignant neoplasm of brain: Secondary | ICD-10-CM | POA: Diagnosis not present

## 2016-08-19 DIAGNOSIS — C7800 Secondary malignant neoplasm of unspecified lung: Secondary | ICD-10-CM | POA: Diagnosis not present

## 2016-08-19 DIAGNOSIS — C7801 Secondary malignant neoplasm of right lung: Secondary | ICD-10-CM | POA: Diagnosis not present

## 2016-08-19 DIAGNOSIS — C799 Secondary malignant neoplasm of unspecified site: Secondary | ICD-10-CM | POA: Diagnosis not present

## 2016-09-06 DIAGNOSIS — J069 Acute upper respiratory infection, unspecified: Secondary | ICD-10-CM | POA: Diagnosis not present

## 2016-09-09 DIAGNOSIS — C434 Malignant melanoma of scalp and neck: Secondary | ICD-10-CM | POA: Diagnosis not present

## 2016-09-09 DIAGNOSIS — C7800 Secondary malignant neoplasm of unspecified lung: Secondary | ICD-10-CM | POA: Diagnosis not present

## 2016-09-09 DIAGNOSIS — Z5111 Encounter for antineoplastic chemotherapy: Secondary | ICD-10-CM | POA: Diagnosis not present

## 2016-09-09 DIAGNOSIS — C7931 Secondary malignant neoplasm of brain: Secondary | ICD-10-CM | POA: Diagnosis not present

## 2016-09-30 DIAGNOSIS — C7800 Secondary malignant neoplasm of unspecified lung: Secondary | ICD-10-CM | POA: Diagnosis not present

## 2016-09-30 DIAGNOSIS — Z9889 Other specified postprocedural states: Secondary | ICD-10-CM | POA: Diagnosis not present

## 2016-09-30 DIAGNOSIS — C434 Malignant melanoma of scalp and neck: Secondary | ICD-10-CM | POA: Diagnosis not present

## 2016-09-30 DIAGNOSIS — Z5112 Encounter for antineoplastic immunotherapy: Secondary | ICD-10-CM | POA: Diagnosis not present

## 2016-09-30 DIAGNOSIS — C799 Secondary malignant neoplasm of unspecified site: Secondary | ICD-10-CM | POA: Diagnosis not present

## 2016-09-30 DIAGNOSIS — L299 Pruritus, unspecified: Secondary | ICD-10-CM | POA: Diagnosis not present

## 2016-09-30 DIAGNOSIS — C7931 Secondary malignant neoplasm of brain: Secondary | ICD-10-CM | POA: Diagnosis not present

## 2016-09-30 DIAGNOSIS — L270 Generalized skin eruption due to drugs and medicaments taken internally: Secondary | ICD-10-CM | POA: Diagnosis not present

## 2016-10-21 DIAGNOSIS — C434 Malignant melanoma of scalp and neck: Secondary | ICD-10-CM | POA: Diagnosis not present

## 2016-10-21 DIAGNOSIS — C799 Secondary malignant neoplasm of unspecified site: Secondary | ICD-10-CM | POA: Diagnosis not present

## 2016-10-21 DIAGNOSIS — T50905A Adverse effect of unspecified drugs, medicaments and biological substances, initial encounter: Secondary | ICD-10-CM | POA: Diagnosis not present

## 2016-10-21 DIAGNOSIS — Z85820 Personal history of malignant melanoma of skin: Secondary | ICD-10-CM | POA: Diagnosis not present

## 2016-10-21 DIAGNOSIS — L298 Other pruritus: Secondary | ICD-10-CM | POA: Diagnosis not present

## 2016-10-21 DIAGNOSIS — L299 Pruritus, unspecified: Secondary | ICD-10-CM | POA: Diagnosis not present

## 2016-10-21 DIAGNOSIS — C7931 Secondary malignant neoplasm of brain: Secondary | ICD-10-CM | POA: Diagnosis not present

## 2016-10-21 DIAGNOSIS — C7800 Secondary malignant neoplasm of unspecified lung: Secondary | ICD-10-CM | POA: Diagnosis not present

## 2016-11-11 DIAGNOSIS — Z9889 Other specified postprocedural states: Secondary | ICD-10-CM | POA: Diagnosis not present

## 2016-11-11 DIAGNOSIS — L299 Pruritus, unspecified: Secondary | ICD-10-CM | POA: Diagnosis not present

## 2016-11-11 DIAGNOSIS — C434 Malignant melanoma of scalp and neck: Secondary | ICD-10-CM | POA: Diagnosis not present

## 2016-11-11 DIAGNOSIS — C7931 Secondary malignant neoplasm of brain: Secondary | ICD-10-CM | POA: Diagnosis not present

## 2016-11-11 DIAGNOSIS — Z5112 Encounter for antineoplastic immunotherapy: Secondary | ICD-10-CM | POA: Diagnosis not present

## 2016-11-11 DIAGNOSIS — C799 Secondary malignant neoplasm of unspecified site: Secondary | ICD-10-CM | POA: Diagnosis not present

## 2016-11-11 DIAGNOSIS — C7800 Secondary malignant neoplasm of unspecified lung: Secondary | ICD-10-CM | POA: Diagnosis not present

## 2016-11-16 DIAGNOSIS — C7931 Secondary malignant neoplasm of brain: Secondary | ICD-10-CM | POA: Diagnosis not present

## 2016-11-16 DIAGNOSIS — Z85820 Personal history of malignant melanoma of skin: Secondary | ICD-10-CM | POA: Diagnosis not present

## 2016-11-16 DIAGNOSIS — Z08 Encounter for follow-up examination after completed treatment for malignant neoplasm: Secondary | ICD-10-CM | POA: Diagnosis not present

## 2016-11-16 DIAGNOSIS — M545 Low back pain: Secondary | ICD-10-CM | POA: Diagnosis not present

## 2016-11-16 DIAGNOSIS — C439 Malignant melanoma of skin, unspecified: Secondary | ICD-10-CM | POA: Diagnosis not present

## 2016-11-16 DIAGNOSIS — C434 Malignant melanoma of scalp and neck: Secondary | ICD-10-CM | POA: Diagnosis not present

## 2016-11-16 DIAGNOSIS — R918 Other nonspecific abnormal finding of lung field: Secondary | ICD-10-CM | POA: Diagnosis not present

## 2016-11-16 DIAGNOSIS — Z85841 Personal history of malignant neoplasm of brain: Secondary | ICD-10-CM | POA: Diagnosis not present

## 2016-11-16 DIAGNOSIS — M5441 Lumbago with sciatica, right side: Secondary | ICD-10-CM | POA: Diagnosis not present

## 2016-12-02 DIAGNOSIS — C7800 Secondary malignant neoplasm of unspecified lung: Secondary | ICD-10-CM | POA: Diagnosis not present

## 2016-12-02 DIAGNOSIS — L299 Pruritus, unspecified: Secondary | ICD-10-CM | POA: Diagnosis not present

## 2016-12-02 DIAGNOSIS — M549 Dorsalgia, unspecified: Secondary | ICD-10-CM | POA: Diagnosis not present

## 2016-12-02 DIAGNOSIS — C7931 Secondary malignant neoplasm of brain: Secondary | ICD-10-CM | POA: Diagnosis not present

## 2016-12-02 DIAGNOSIS — M9689 Other intraoperative and postprocedural complications and disorders of the musculoskeletal system: Secondary | ICD-10-CM | POA: Diagnosis not present

## 2016-12-02 DIAGNOSIS — T1490XS Injury, unspecified, sequela: Secondary | ICD-10-CM | POA: Diagnosis not present

## 2016-12-02 DIAGNOSIS — C434 Malignant melanoma of scalp and neck: Secondary | ICD-10-CM | POA: Diagnosis not present

## 2016-12-02 DIAGNOSIS — R7989 Other specified abnormal findings of blood chemistry: Secondary | ICD-10-CM | POA: Diagnosis not present

## 2016-12-02 DIAGNOSIS — L298 Other pruritus: Secondary | ICD-10-CM | POA: Diagnosis not present

## 2016-12-10 DIAGNOSIS — M545 Low back pain: Secondary | ICD-10-CM | POA: Diagnosis not present

## 2016-12-10 DIAGNOSIS — C439 Malignant melanoma of skin, unspecified: Secondary | ICD-10-CM | POA: Diagnosis not present

## 2016-12-10 DIAGNOSIS — M5126 Other intervertebral disc displacement, lumbar region: Secondary | ICD-10-CM | POA: Diagnosis not present

## 2016-12-10 DIAGNOSIS — M47816 Spondylosis without myelopathy or radiculopathy, lumbar region: Secondary | ICD-10-CM | POA: Diagnosis not present

## 2016-12-10 DIAGNOSIS — Z9889 Other specified postprocedural states: Secondary | ICD-10-CM | POA: Diagnosis not present

## 2016-12-10 DIAGNOSIS — M48061 Spinal stenosis, lumbar region without neurogenic claudication: Secondary | ICD-10-CM | POA: Diagnosis not present

## 2016-12-18 DIAGNOSIS — T7840XA Allergy, unspecified, initial encounter: Secondary | ICD-10-CM | POA: Diagnosis not present

## 2016-12-18 DIAGNOSIS — C439 Malignant melanoma of skin, unspecified: Secondary | ICD-10-CM | POA: Diagnosis not present

## 2016-12-18 DIAGNOSIS — C799 Secondary malignant neoplasm of unspecified site: Secondary | ICD-10-CM | POA: Diagnosis not present

## 2016-12-18 DIAGNOSIS — Z23 Encounter for immunization: Secondary | ICD-10-CM | POA: Diagnosis not present

## 2016-12-18 DIAGNOSIS — Z6824 Body mass index (BMI) 24.0-24.9, adult: Secondary | ICD-10-CM | POA: Diagnosis not present

## 2016-12-23 DIAGNOSIS — L299 Pruritus, unspecified: Secondary | ICD-10-CM | POA: Diagnosis not present

## 2016-12-23 DIAGNOSIS — C799 Secondary malignant neoplasm of unspecified site: Secondary | ICD-10-CM | POA: Diagnosis not present

## 2016-12-23 DIAGNOSIS — C7931 Secondary malignant neoplasm of brain: Secondary | ICD-10-CM | POA: Diagnosis not present

## 2016-12-23 DIAGNOSIS — C439 Malignant melanoma of skin, unspecified: Secondary | ICD-10-CM | POA: Diagnosis not present

## 2016-12-23 DIAGNOSIS — Z794 Long term (current) use of insulin: Secondary | ICD-10-CM | POA: Diagnosis not present

## 2016-12-23 DIAGNOSIS — C7800 Secondary malignant neoplasm of unspecified lung: Secondary | ICD-10-CM | POA: Diagnosis not present

## 2016-12-23 DIAGNOSIS — E119 Type 2 diabetes mellitus without complications: Secondary | ICD-10-CM | POA: Diagnosis not present

## 2016-12-23 DIAGNOSIS — C434 Malignant melanoma of scalp and neck: Secondary | ICD-10-CM | POA: Diagnosis not present

## 2016-12-23 DIAGNOSIS — R972 Elevated prostate specific antigen [PSA]: Secondary | ICD-10-CM | POA: Diagnosis not present

## 2016-12-23 DIAGNOSIS — N400 Enlarged prostate without lower urinary tract symptoms: Secondary | ICD-10-CM | POA: Diagnosis not present

## 2016-12-23 DIAGNOSIS — M549 Dorsalgia, unspecified: Secondary | ICD-10-CM | POA: Diagnosis not present

## 2016-12-25 DIAGNOSIS — C799 Secondary malignant neoplasm of unspecified site: Secondary | ICD-10-CM | POA: Diagnosis not present

## 2016-12-25 DIAGNOSIS — M961 Postlaminectomy syndrome, not elsewhere classified: Secondary | ICD-10-CM | POA: Diagnosis not present

## 2016-12-25 DIAGNOSIS — M5416 Radiculopathy, lumbar region: Secondary | ICD-10-CM | POA: Diagnosis not present

## 2016-12-25 DIAGNOSIS — M48062 Spinal stenosis, lumbar region with neurogenic claudication: Secondary | ICD-10-CM | POA: Diagnosis not present

## 2016-12-25 DIAGNOSIS — Z9889 Other specified postprocedural states: Secondary | ICD-10-CM | POA: Diagnosis not present

## 2016-12-25 DIAGNOSIS — C7931 Secondary malignant neoplasm of brain: Secondary | ICD-10-CM | POA: Diagnosis not present

## 2016-12-25 DIAGNOSIS — Z85820 Personal history of malignant melanoma of skin: Secondary | ICD-10-CM | POA: Diagnosis not present

## 2016-12-28 DIAGNOSIS — N138 Other obstructive and reflux uropathy: Secondary | ICD-10-CM | POA: Diagnosis not present

## 2016-12-28 DIAGNOSIS — C799 Secondary malignant neoplasm of unspecified site: Secondary | ICD-10-CM | POA: Diagnosis not present

## 2016-12-28 DIAGNOSIS — R8290 Unspecified abnormal findings in urine: Secondary | ICD-10-CM | POA: Diagnosis not present

## 2016-12-28 DIAGNOSIS — R972 Elevated prostate specific antigen [PSA]: Secondary | ICD-10-CM | POA: Diagnosis not present

## 2016-12-28 DIAGNOSIS — N401 Enlarged prostate with lower urinary tract symptoms: Secondary | ICD-10-CM | POA: Diagnosis not present

## 2016-12-31 DIAGNOSIS — H04123 Dry eye syndrome of bilateral lacrimal glands: Secondary | ICD-10-CM | POA: Diagnosis not present

## 2016-12-31 DIAGNOSIS — H109 Unspecified conjunctivitis: Secondary | ICD-10-CM | POA: Diagnosis not present

## 2017-01-06 DIAGNOSIS — J019 Acute sinusitis, unspecified: Secondary | ICD-10-CM | POA: Diagnosis not present

## 2017-01-06 DIAGNOSIS — Z6824 Body mass index (BMI) 24.0-24.9, adult: Secondary | ICD-10-CM | POA: Diagnosis not present

## 2017-01-06 DIAGNOSIS — J309 Allergic rhinitis, unspecified: Secondary | ICD-10-CM | POA: Diagnosis not present

## 2017-01-06 DIAGNOSIS — F411 Generalized anxiety disorder: Secondary | ICD-10-CM | POA: Diagnosis not present

## 2017-01-11 DIAGNOSIS — H16123 Filamentary keratitis, bilateral: Secondary | ICD-10-CM | POA: Diagnosis not present

## 2017-01-11 DIAGNOSIS — H04123 Dry eye syndrome of bilateral lacrimal glands: Secondary | ICD-10-CM | POA: Diagnosis not present

## 2017-01-11 DIAGNOSIS — H109 Unspecified conjunctivitis: Secondary | ICD-10-CM | POA: Diagnosis not present

## 2017-01-20 DIAGNOSIS — M549 Dorsalgia, unspecified: Secondary | ICD-10-CM | POA: Diagnosis not present

## 2017-01-20 DIAGNOSIS — C434 Malignant melanoma of scalp and neck: Secondary | ICD-10-CM | POA: Diagnosis not present

## 2017-01-20 DIAGNOSIS — C799 Secondary malignant neoplasm of unspecified site: Secondary | ICD-10-CM | POA: Diagnosis not present

## 2017-01-20 DIAGNOSIS — N400 Enlarged prostate without lower urinary tract symptoms: Secondary | ICD-10-CM | POA: Diagnosis not present

## 2017-01-20 DIAGNOSIS — R972 Elevated prostate specific antigen [PSA]: Secondary | ICD-10-CM | POA: Diagnosis not present

## 2017-01-20 DIAGNOSIS — Z9889 Other specified postprocedural states: Secondary | ICD-10-CM | POA: Diagnosis not present

## 2017-01-20 DIAGNOSIS — T1490XS Injury, unspecified, sequela: Secondary | ICD-10-CM | POA: Diagnosis not present

## 2017-01-20 DIAGNOSIS — C7800 Secondary malignant neoplasm of unspecified lung: Secondary | ICD-10-CM | POA: Diagnosis not present

## 2017-01-20 DIAGNOSIS — C7931 Secondary malignant neoplasm of brain: Secondary | ICD-10-CM | POA: Diagnosis not present

## 2017-01-20 DIAGNOSIS — L299 Pruritus, unspecified: Secondary | ICD-10-CM | POA: Diagnosis not present

## 2017-01-22 DIAGNOSIS — H16123 Filamentary keratitis, bilateral: Secondary | ICD-10-CM | POA: Diagnosis not present

## 2017-01-22 DIAGNOSIS — H109 Unspecified conjunctivitis: Secondary | ICD-10-CM | POA: Diagnosis not present

## 2017-01-22 DIAGNOSIS — H18412 Arcus senilis, left eye: Secondary | ICD-10-CM | POA: Diagnosis not present

## 2017-01-22 DIAGNOSIS — H04123 Dry eye syndrome of bilateral lacrimal glands: Secondary | ICD-10-CM | POA: Diagnosis not present

## 2017-01-25 DIAGNOSIS — H04123 Dry eye syndrome of bilateral lacrimal glands: Secondary | ICD-10-CM | POA: Diagnosis not present

## 2017-01-25 DIAGNOSIS — H16103 Unspecified superficial keratitis, bilateral: Secondary | ICD-10-CM | POA: Diagnosis not present

## 2017-01-25 DIAGNOSIS — N401 Enlarged prostate with lower urinary tract symptoms: Secondary | ICD-10-CM | POA: Diagnosis not present

## 2017-01-25 DIAGNOSIS — R972 Elevated prostate specific antigen [PSA]: Secondary | ICD-10-CM | POA: Diagnosis not present

## 2017-01-25 DIAGNOSIS — C799 Secondary malignant neoplasm of unspecified site: Secondary | ICD-10-CM | POA: Diagnosis not present

## 2017-01-25 DIAGNOSIS — S0502XA Injury of conjunctiva and corneal abrasion without foreign body, left eye, initial encounter: Secondary | ICD-10-CM | POA: Diagnosis not present

## 2017-01-25 DIAGNOSIS — C7931 Secondary malignant neoplasm of brain: Secondary | ICD-10-CM | POA: Diagnosis not present

## 2017-01-27 DIAGNOSIS — H17822 Peripheral opacity of cornea, left eye: Secondary | ICD-10-CM | POA: Diagnosis not present

## 2017-01-27 DIAGNOSIS — H16123 Filamentary keratitis, bilateral: Secondary | ICD-10-CM | POA: Diagnosis not present

## 2017-01-27 DIAGNOSIS — H04123 Dry eye syndrome of bilateral lacrimal glands: Secondary | ICD-10-CM | POA: Diagnosis not present

## 2017-02-10 DIAGNOSIS — Z794 Long term (current) use of insulin: Secondary | ICD-10-CM | POA: Diagnosis not present

## 2017-02-10 DIAGNOSIS — C7800 Secondary malignant neoplasm of unspecified lung: Secondary | ICD-10-CM | POA: Diagnosis not present

## 2017-02-10 DIAGNOSIS — R1030 Lower abdominal pain, unspecified: Secondary | ICD-10-CM | POA: Diagnosis not present

## 2017-02-10 DIAGNOSIS — C434 Malignant melanoma of scalp and neck: Secondary | ICD-10-CM | POA: Diagnosis not present

## 2017-02-10 DIAGNOSIS — C7931 Secondary malignant neoplasm of brain: Secondary | ICD-10-CM | POA: Diagnosis not present

## 2017-02-10 DIAGNOSIS — N400 Enlarged prostate without lower urinary tract symptoms: Secondary | ICD-10-CM | POA: Diagnosis not present

## 2017-02-10 DIAGNOSIS — R972 Elevated prostate specific antigen [PSA]: Secondary | ICD-10-CM | POA: Diagnosis not present

## 2017-02-10 DIAGNOSIS — Z9889 Other specified postprocedural states: Secondary | ICD-10-CM | POA: Diagnosis not present

## 2017-02-10 DIAGNOSIS — E119 Type 2 diabetes mellitus without complications: Secondary | ICD-10-CM | POA: Diagnosis not present

## 2017-02-10 DIAGNOSIS — M5489 Other dorsalgia: Secondary | ICD-10-CM | POA: Diagnosis not present

## 2017-02-10 DIAGNOSIS — R197 Diarrhea, unspecified: Secondary | ICD-10-CM | POA: Diagnosis not present

## 2017-02-10 DIAGNOSIS — N419 Inflammatory disease of prostate, unspecified: Secondary | ICD-10-CM | POA: Diagnosis not present

## 2017-02-15 DIAGNOSIS — R197 Diarrhea, unspecified: Secondary | ICD-10-CM | POA: Diagnosis not present

## 2017-02-24 DIAGNOSIS — C434 Malignant melanoma of scalp and neck: Secondary | ICD-10-CM | POA: Diagnosis not present

## 2017-02-24 DIAGNOSIS — C799 Secondary malignant neoplasm of unspecified site: Secondary | ICD-10-CM | POA: Diagnosis not present

## 2017-02-24 DIAGNOSIS — C7931 Secondary malignant neoplasm of brain: Secondary | ICD-10-CM | POA: Diagnosis not present

## 2017-03-02 DIAGNOSIS — Z881 Allergy status to other antibiotic agents status: Secondary | ICD-10-CM | POA: Diagnosis not present

## 2017-03-02 DIAGNOSIS — M48062 Spinal stenosis, lumbar region with neurogenic claudication: Secondary | ICD-10-CM | POA: Diagnosis not present

## 2017-03-02 DIAGNOSIS — M5416 Radiculopathy, lumbar region: Secondary | ICD-10-CM | POA: Diagnosis not present

## 2017-03-02 DIAGNOSIS — C434 Malignant melanoma of scalp and neck: Secondary | ICD-10-CM | POA: Diagnosis not present

## 2017-03-02 DIAGNOSIS — Z9081 Acquired absence of spleen: Secondary | ICD-10-CM | POA: Diagnosis not present

## 2017-03-02 DIAGNOSIS — M961 Postlaminectomy syndrome, not elsewhere classified: Secondary | ICD-10-CM | POA: Diagnosis not present

## 2017-03-02 DIAGNOSIS — Z9889 Other specified postprocedural states: Secondary | ICD-10-CM | POA: Diagnosis not present

## 2017-03-02 DIAGNOSIS — C7931 Secondary malignant neoplasm of brain: Secondary | ICD-10-CM | POA: Diagnosis not present

## 2017-03-02 DIAGNOSIS — Z91040 Latex allergy status: Secondary | ICD-10-CM | POA: Diagnosis not present

## 2017-03-02 DIAGNOSIS — Z9181 History of falling: Secondary | ICD-10-CM | POA: Diagnosis not present

## 2017-03-02 DIAGNOSIS — C7800 Secondary malignant neoplasm of unspecified lung: Secondary | ICD-10-CM | POA: Diagnosis not present

## 2017-03-02 DIAGNOSIS — E11319 Type 2 diabetes mellitus with unspecified diabetic retinopathy without macular edema: Secondary | ICD-10-CM | POA: Diagnosis not present

## 2017-03-02 DIAGNOSIS — D72829 Elevated white blood cell count, unspecified: Secondary | ICD-10-CM | POA: Diagnosis not present

## 2017-03-02 DIAGNOSIS — Z794 Long term (current) use of insulin: Secondary | ICD-10-CM | POA: Diagnosis not present

## 2017-03-02 DIAGNOSIS — Z885 Allergy status to narcotic agent status: Secondary | ICD-10-CM | POA: Diagnosis not present

## 2017-03-03 DIAGNOSIS — C7951 Secondary malignant neoplasm of bone: Secondary | ICD-10-CM | POA: Diagnosis not present

## 2017-03-03 DIAGNOSIS — C7800 Secondary malignant neoplasm of unspecified lung: Secondary | ICD-10-CM | POA: Diagnosis not present

## 2017-03-03 DIAGNOSIS — M549 Dorsalgia, unspecified: Secondary | ICD-10-CM | POA: Diagnosis not present

## 2017-03-03 DIAGNOSIS — M5416 Radiculopathy, lumbar region: Secondary | ICD-10-CM | POA: Diagnosis not present

## 2017-03-03 DIAGNOSIS — C7931 Secondary malignant neoplasm of brain: Secondary | ICD-10-CM | POA: Diagnosis not present

## 2017-03-03 DIAGNOSIS — K529 Noninfective gastroenteritis and colitis, unspecified: Secondary | ICD-10-CM | POA: Diagnosis not present

## 2017-03-03 DIAGNOSIS — A09 Infectious gastroenteritis and colitis, unspecified: Secondary | ICD-10-CM | POA: Diagnosis not present

## 2017-03-03 DIAGNOSIS — C434 Malignant melanoma of scalp and neck: Secondary | ICD-10-CM | POA: Diagnosis not present

## 2017-03-08 DIAGNOSIS — H04123 Dry eye syndrome of bilateral lacrimal glands: Secondary | ICD-10-CM | POA: Diagnosis not present

## 2017-03-08 DIAGNOSIS — H16123 Filamentary keratitis, bilateral: Secondary | ICD-10-CM | POA: Diagnosis not present

## 2017-03-08 DIAGNOSIS — H10403 Unspecified chronic conjunctivitis, bilateral: Secondary | ICD-10-CM | POA: Diagnosis not present

## 2017-03-08 DIAGNOSIS — H17822 Peripheral opacity of cornea, left eye: Secondary | ICD-10-CM | POA: Diagnosis not present

## 2017-03-10 DIAGNOSIS — C7931 Secondary malignant neoplasm of brain: Secondary | ICD-10-CM | POA: Diagnosis not present

## 2017-03-10 DIAGNOSIS — Z5112 Encounter for antineoplastic immunotherapy: Secondary | ICD-10-CM | POA: Diagnosis not present

## 2017-03-10 DIAGNOSIS — C799 Secondary malignant neoplasm of unspecified site: Secondary | ICD-10-CM | POA: Diagnosis not present

## 2017-03-10 DIAGNOSIS — C7800 Secondary malignant neoplasm of unspecified lung: Secondary | ICD-10-CM | POA: Diagnosis not present

## 2017-03-10 DIAGNOSIS — M2550 Pain in unspecified joint: Secondary | ICD-10-CM | POA: Diagnosis not present

## 2017-04-19 DIAGNOSIS — H17822 Peripheral opacity of cornea, left eye: Secondary | ICD-10-CM | POA: Diagnosis not present

## 2017-04-19 DIAGNOSIS — H16122 Filamentary keratitis, left eye: Secondary | ICD-10-CM | POA: Diagnosis not present

## 2017-04-19 DIAGNOSIS — H40053 Ocular hypertension, bilateral: Secondary | ICD-10-CM | POA: Diagnosis not present

## 2017-04-19 DIAGNOSIS — H04123 Dry eye syndrome of bilateral lacrimal glands: Secondary | ICD-10-CM | POA: Diagnosis not present

## 2017-04-19 DIAGNOSIS — H10403 Unspecified chronic conjunctivitis, bilateral: Secondary | ICD-10-CM | POA: Diagnosis not present

## 2017-04-21 DIAGNOSIS — Z794 Long term (current) use of insulin: Secondary | ICD-10-CM | POA: Diagnosis not present

## 2017-04-21 DIAGNOSIS — E119 Type 2 diabetes mellitus without complications: Secondary | ICD-10-CM | POA: Diagnosis not present

## 2017-04-21 DIAGNOSIS — C799 Secondary malignant neoplasm of unspecified site: Secondary | ICD-10-CM | POA: Diagnosis not present

## 2017-04-21 DIAGNOSIS — C7931 Secondary malignant neoplasm of brain: Secondary | ICD-10-CM | POA: Diagnosis not present

## 2017-04-21 DIAGNOSIS — H109 Unspecified conjunctivitis: Secondary | ICD-10-CM | POA: Diagnosis not present

## 2017-04-21 DIAGNOSIS — M5416 Radiculopathy, lumbar region: Secondary | ICD-10-CM | POA: Diagnosis not present

## 2017-04-21 DIAGNOSIS — C7800 Secondary malignant neoplasm of unspecified lung: Secondary | ICD-10-CM | POA: Diagnosis not present

## 2017-04-21 DIAGNOSIS — D72829 Elevated white blood cell count, unspecified: Secondary | ICD-10-CM | POA: Diagnosis not present

## 2017-04-21 DIAGNOSIS — K529 Noninfective gastroenteritis and colitis, unspecified: Secondary | ICD-10-CM | POA: Diagnosis not present

## 2017-04-21 DIAGNOSIS — Z9889 Other specified postprocedural states: Secondary | ICD-10-CM | POA: Diagnosis not present

## 2017-04-21 DIAGNOSIS — L309 Dermatitis, unspecified: Secondary | ICD-10-CM | POA: Diagnosis not present

## 2017-04-21 DIAGNOSIS — T451X5A Adverse effect of antineoplastic and immunosuppressive drugs, initial encounter: Secondary | ICD-10-CM | POA: Diagnosis not present

## 2017-04-21 DIAGNOSIS — C434 Malignant melanoma of scalp and neck: Secondary | ICD-10-CM | POA: Diagnosis not present

## 2017-04-21 DIAGNOSIS — H1089 Other conjunctivitis: Secondary | ICD-10-CM | POA: Diagnosis not present

## 2017-04-21 DIAGNOSIS — A0472 Enterocolitis due to Clostridium difficile, not specified as recurrent: Secondary | ICD-10-CM | POA: Diagnosis not present

## 2017-04-26 DIAGNOSIS — H109 Unspecified conjunctivitis: Secondary | ICD-10-CM | POA: Diagnosis not present

## 2017-04-26 DIAGNOSIS — H10403 Unspecified chronic conjunctivitis, bilateral: Secondary | ICD-10-CM | POA: Diagnosis not present

## 2017-04-26 DIAGNOSIS — H40053 Ocular hypertension, bilateral: Secondary | ICD-10-CM | POA: Diagnosis not present

## 2017-04-26 DIAGNOSIS — H04123 Dry eye syndrome of bilateral lacrimal glands: Secondary | ICD-10-CM | POA: Diagnosis not present

## 2017-04-26 DIAGNOSIS — H17822 Peripheral opacity of cornea, left eye: Secondary | ICD-10-CM | POA: Diagnosis not present

## 2017-05-14 DIAGNOSIS — C439 Malignant melanoma of skin, unspecified: Secondary | ICD-10-CM | POA: Diagnosis not present

## 2017-05-14 DIAGNOSIS — Z6822 Body mass index (BMI) 22.0-22.9, adult: Secondary | ICD-10-CM | POA: Diagnosis not present

## 2017-05-14 DIAGNOSIS — R197 Diarrhea, unspecified: Secondary | ICD-10-CM | POA: Diagnosis not present

## 2017-05-14 DIAGNOSIS — N390 Urinary tract infection, site not specified: Secondary | ICD-10-CM | POA: Diagnosis not present

## 2017-05-14 DIAGNOSIS — A498 Other bacterial infections of unspecified site: Secondary | ICD-10-CM | POA: Diagnosis not present

## 2017-05-17 DIAGNOSIS — H40053 Ocular hypertension, bilateral: Secondary | ICD-10-CM | POA: Diagnosis not present

## 2017-05-17 DIAGNOSIS — H04123 Dry eye syndrome of bilateral lacrimal glands: Secondary | ICD-10-CM | POA: Diagnosis not present

## 2017-05-17 DIAGNOSIS — R197 Diarrhea, unspecified: Secondary | ICD-10-CM | POA: Diagnosis not present

## 2017-05-17 DIAGNOSIS — H109 Unspecified conjunctivitis: Secondary | ICD-10-CM | POA: Diagnosis not present

## 2017-05-17 DIAGNOSIS — H17822 Peripheral opacity of cornea, left eye: Secondary | ICD-10-CM | POA: Diagnosis not present

## 2017-05-17 DIAGNOSIS — H10413 Chronic giant papillary conjunctivitis, bilateral: Secondary | ICD-10-CM | POA: Diagnosis not present

## 2017-05-31 DIAGNOSIS — C434 Malignant melanoma of scalp and neck: Secondary | ICD-10-CM | POA: Diagnosis not present

## 2017-05-31 DIAGNOSIS — C799 Secondary malignant neoplasm of unspecified site: Secondary | ICD-10-CM | POA: Diagnosis not present

## 2017-05-31 DIAGNOSIS — K529 Noninfective gastroenteritis and colitis, unspecified: Secondary | ICD-10-CM | POA: Diagnosis not present

## 2017-05-31 DIAGNOSIS — C7801 Secondary malignant neoplasm of right lung: Secondary | ICD-10-CM | POA: Diagnosis not present

## 2017-05-31 DIAGNOSIS — C7931 Secondary malignant neoplasm of brain: Secondary | ICD-10-CM | POA: Diagnosis not present

## 2017-06-02 DIAGNOSIS — D72829 Elevated white blood cell count, unspecified: Secondary | ICD-10-CM | POA: Diagnosis not present

## 2017-06-02 DIAGNOSIS — C439 Malignant melanoma of skin, unspecified: Secondary | ICD-10-CM | POA: Diagnosis not present

## 2017-06-02 DIAGNOSIS — C7931 Secondary malignant neoplasm of brain: Secondary | ICD-10-CM | POA: Diagnosis not present

## 2017-06-02 DIAGNOSIS — Z9081 Acquired absence of spleen: Secondary | ICD-10-CM | POA: Diagnosis not present

## 2017-06-02 DIAGNOSIS — H109 Unspecified conjunctivitis: Secondary | ICD-10-CM | POA: Diagnosis not present

## 2017-06-02 DIAGNOSIS — C7800 Secondary malignant neoplasm of unspecified lung: Secondary | ICD-10-CM | POA: Diagnosis not present

## 2017-06-02 DIAGNOSIS — K529 Noninfective gastroenteritis and colitis, unspecified: Secondary | ICD-10-CM | POA: Diagnosis not present

## 2017-06-02 DIAGNOSIS — Z9889 Other specified postprocedural states: Secondary | ICD-10-CM | POA: Diagnosis not present

## 2017-06-02 DIAGNOSIS — C434 Malignant melanoma of scalp and neck: Secondary | ICD-10-CM | POA: Diagnosis not present

## 2017-06-02 DIAGNOSIS — L309 Dermatitis, unspecified: Secondary | ICD-10-CM | POA: Diagnosis not present

## 2017-06-02 DIAGNOSIS — T380X5A Adverse effect of glucocorticoids and synthetic analogues, initial encounter: Secondary | ICD-10-CM | POA: Diagnosis not present

## 2017-06-02 DIAGNOSIS — Z79899 Other long term (current) drug therapy: Secondary | ICD-10-CM | POA: Diagnosis not present

## 2017-06-02 DIAGNOSIS — D473 Essential (hemorrhagic) thrombocythemia: Secondary | ICD-10-CM | POA: Diagnosis not present

## 2017-06-07 DIAGNOSIS — H109 Unspecified conjunctivitis: Secondary | ICD-10-CM | POA: Diagnosis not present

## 2017-06-07 DIAGNOSIS — H17822 Peripheral opacity of cornea, left eye: Secondary | ICD-10-CM | POA: Diagnosis not present

## 2017-06-07 DIAGNOSIS — H04123 Dry eye syndrome of bilateral lacrimal glands: Secondary | ICD-10-CM | POA: Diagnosis not present

## 2017-06-07 DIAGNOSIS — H40053 Ocular hypertension, bilateral: Secondary | ICD-10-CM | POA: Diagnosis not present

## 2017-06-07 DIAGNOSIS — H10413 Chronic giant papillary conjunctivitis, bilateral: Secondary | ICD-10-CM | POA: Diagnosis not present

## 2017-07-12 DIAGNOSIS — Z6822 Body mass index (BMI) 22.0-22.9, adult: Secondary | ICD-10-CM | POA: Diagnosis not present

## 2017-07-12 DIAGNOSIS — R197 Diarrhea, unspecified: Secondary | ICD-10-CM | POA: Diagnosis not present

## 2017-07-12 DIAGNOSIS — F411 Generalized anxiety disorder: Secondary | ICD-10-CM | POA: Diagnosis not present

## 2017-07-12 DIAGNOSIS — N390 Urinary tract infection, site not specified: Secondary | ICD-10-CM | POA: Diagnosis not present

## 2017-07-14 DIAGNOSIS — Z885 Allergy status to narcotic agent status: Secondary | ICD-10-CM | POA: Diagnosis not present

## 2017-07-14 DIAGNOSIS — H109 Unspecified conjunctivitis: Secondary | ICD-10-CM | POA: Diagnosis not present

## 2017-07-14 DIAGNOSIS — C434 Malignant melanoma of scalp and neck: Secondary | ICD-10-CM | POA: Diagnosis not present

## 2017-07-14 DIAGNOSIS — Z888 Allergy status to other drugs, medicaments and biological substances status: Secondary | ICD-10-CM | POA: Diagnosis not present

## 2017-07-14 DIAGNOSIS — C7931 Secondary malignant neoplasm of brain: Secondary | ICD-10-CM | POA: Diagnosis not present

## 2017-07-14 DIAGNOSIS — C799 Secondary malignant neoplasm of unspecified site: Secondary | ICD-10-CM | POA: Diagnosis not present

## 2017-07-14 DIAGNOSIS — Z91040 Latex allergy status: Secondary | ICD-10-CM | POA: Diagnosis not present

## 2017-07-14 DIAGNOSIS — C7800 Secondary malignant neoplasm of unspecified lung: Secondary | ICD-10-CM | POA: Diagnosis not present

## 2017-07-14 DIAGNOSIS — M2550 Pain in unspecified joint: Secondary | ICD-10-CM | POA: Diagnosis not present

## 2017-07-14 DIAGNOSIS — D473 Essential (hemorrhagic) thrombocythemia: Secondary | ICD-10-CM | POA: Diagnosis not present

## 2017-07-14 DIAGNOSIS — Z9081 Acquired absence of spleen: Secondary | ICD-10-CM | POA: Diagnosis not present

## 2017-07-14 DIAGNOSIS — C439 Malignant melanoma of skin, unspecified: Secondary | ICD-10-CM | POA: Diagnosis not present

## 2017-07-21 DIAGNOSIS — H40053 Ocular hypertension, bilateral: Secondary | ICD-10-CM | POA: Diagnosis not present

## 2017-07-21 DIAGNOSIS — H04123 Dry eye syndrome of bilateral lacrimal glands: Secondary | ICD-10-CM | POA: Diagnosis not present

## 2017-07-21 DIAGNOSIS — H10413 Chronic giant papillary conjunctivitis, bilateral: Secondary | ICD-10-CM | POA: Diagnosis not present

## 2017-07-21 DIAGNOSIS — H17822 Peripheral opacity of cornea, left eye: Secondary | ICD-10-CM | POA: Diagnosis not present

## 2017-07-21 DIAGNOSIS — H109 Unspecified conjunctivitis: Secondary | ICD-10-CM | POA: Diagnosis not present

## 2017-08-02 DIAGNOSIS — Z9889 Other specified postprocedural states: Secondary | ICD-10-CM | POA: Diagnosis not present

## 2017-08-02 DIAGNOSIS — C434 Malignant melanoma of scalp and neck: Secondary | ICD-10-CM | POA: Diagnosis not present

## 2017-08-02 DIAGNOSIS — C7931 Secondary malignant neoplasm of brain: Secondary | ICD-10-CM | POA: Diagnosis not present

## 2017-08-03 DIAGNOSIS — M48061 Spinal stenosis, lumbar region without neurogenic claudication: Secondary | ICD-10-CM | POA: Diagnosis not present

## 2017-08-11 DIAGNOSIS — Z9889 Other specified postprocedural states: Secondary | ICD-10-CM | POA: Diagnosis not present

## 2017-08-11 DIAGNOSIS — M48062 Spinal stenosis, lumbar region with neurogenic claudication: Secondary | ICD-10-CM | POA: Diagnosis not present

## 2017-08-13 DIAGNOSIS — E08311 Diabetes mellitus due to underlying condition with unspecified diabetic retinopathy with macular edema: Secondary | ICD-10-CM | POA: Diagnosis not present

## 2017-08-16 DIAGNOSIS — Z01810 Encounter for preprocedural cardiovascular examination: Secondary | ICD-10-CM | POA: Diagnosis not present

## 2017-08-16 DIAGNOSIS — M48062 Spinal stenosis, lumbar region with neurogenic claudication: Secondary | ICD-10-CM | POA: Diagnosis not present

## 2017-08-20 DIAGNOSIS — Z85820 Personal history of malignant melanoma of skin: Secondary | ICD-10-CM | POA: Diagnosis not present

## 2017-08-20 DIAGNOSIS — Z9889 Other specified postprocedural states: Secondary | ICD-10-CM | POA: Diagnosis not present

## 2017-08-20 DIAGNOSIS — E119 Type 2 diabetes mellitus without complications: Secondary | ICD-10-CM | POA: Diagnosis not present

## 2017-08-20 DIAGNOSIS — Z08 Encounter for follow-up examination after completed treatment for malignant neoplasm: Secondary | ICD-10-CM | POA: Diagnosis not present

## 2017-08-20 DIAGNOSIS — Z794 Long term (current) use of insulin: Secondary | ICD-10-CM | POA: Diagnosis not present

## 2017-08-20 DIAGNOSIS — M48062 Spinal stenosis, lumbar region with neurogenic claudication: Secondary | ICD-10-CM | POA: Diagnosis not present

## 2017-08-20 DIAGNOSIS — Z87828 Personal history of other (healed) physical injury and trauma: Secondary | ICD-10-CM | POA: Diagnosis not present

## 2017-08-20 DIAGNOSIS — N400 Enlarged prostate without lower urinary tract symptoms: Secondary | ICD-10-CM | POA: Diagnosis not present

## 2017-08-20 DIAGNOSIS — K219 Gastro-esophageal reflux disease without esophagitis: Secondary | ICD-10-CM | POA: Diagnosis not present

## 2017-08-20 DIAGNOSIS — M5126 Other intervertebral disc displacement, lumbar region: Secondary | ICD-10-CM | POA: Diagnosis not present

## 2017-08-20 DIAGNOSIS — F419 Anxiety disorder, unspecified: Secondary | ICD-10-CM | POA: Diagnosis not present

## 2017-08-20 DIAGNOSIS — Z79899 Other long term (current) drug therapy: Secondary | ICD-10-CM | POA: Diagnosis not present

## 2017-08-30 DIAGNOSIS — Z9889 Other specified postprocedural states: Secondary | ICD-10-CM | POA: Diagnosis not present

## 2017-09-13 DIAGNOSIS — H17823 Peripheral opacity of cornea, bilateral: Secondary | ICD-10-CM | POA: Diagnosis not present

## 2017-09-13 DIAGNOSIS — H16103 Unspecified superficial keratitis, bilateral: Secondary | ICD-10-CM | POA: Diagnosis not present

## 2017-09-13 DIAGNOSIS — H2513 Age-related nuclear cataract, bilateral: Secondary | ICD-10-CM | POA: Diagnosis not present

## 2017-09-13 DIAGNOSIS — H16223 Keratoconjunctivitis sicca, not specified as Sjogren's, bilateral: Secondary | ICD-10-CM | POA: Diagnosis not present

## 2017-09-13 DIAGNOSIS — M48061 Spinal stenosis, lumbar region without neurogenic claudication: Secondary | ICD-10-CM | POA: Diagnosis not present

## 2017-09-13 DIAGNOSIS — M6281 Muscle weakness (generalized): Secondary | ICD-10-CM | POA: Diagnosis not present

## 2017-09-13 DIAGNOSIS — R262 Difficulty in walking, not elsewhere classified: Secondary | ICD-10-CM | POA: Diagnosis not present

## 2017-09-17 DIAGNOSIS — R262 Difficulty in walking, not elsewhere classified: Secondary | ICD-10-CM | POA: Diagnosis not present

## 2017-09-17 DIAGNOSIS — M6281 Muscle weakness (generalized): Secondary | ICD-10-CM | POA: Diagnosis not present

## 2017-09-17 DIAGNOSIS — M48061 Spinal stenosis, lumbar region without neurogenic claudication: Secondary | ICD-10-CM | POA: Diagnosis not present

## 2017-09-21 DIAGNOSIS — M48061 Spinal stenosis, lumbar region without neurogenic claudication: Secondary | ICD-10-CM | POA: Diagnosis not present

## 2017-09-21 DIAGNOSIS — R262 Difficulty in walking, not elsewhere classified: Secondary | ICD-10-CM | POA: Diagnosis not present

## 2017-09-21 DIAGNOSIS — M6281 Muscle weakness (generalized): Secondary | ICD-10-CM | POA: Diagnosis not present

## 2017-09-24 DIAGNOSIS — R262 Difficulty in walking, not elsewhere classified: Secondary | ICD-10-CM | POA: Diagnosis not present

## 2017-09-24 DIAGNOSIS — M6281 Muscle weakness (generalized): Secondary | ICD-10-CM | POA: Diagnosis not present

## 2017-09-24 DIAGNOSIS — M48061 Spinal stenosis, lumbar region without neurogenic claudication: Secondary | ICD-10-CM | POA: Diagnosis not present

## 2017-09-27 DIAGNOSIS — M48061 Spinal stenosis, lumbar region without neurogenic claudication: Secondary | ICD-10-CM | POA: Diagnosis not present

## 2017-09-27 DIAGNOSIS — R262 Difficulty in walking, not elsewhere classified: Secondary | ICD-10-CM | POA: Diagnosis not present

## 2017-09-27 DIAGNOSIS — M6281 Muscle weakness (generalized): Secondary | ICD-10-CM | POA: Diagnosis not present

## 2017-09-29 DIAGNOSIS — M48061 Spinal stenosis, lumbar region without neurogenic claudication: Secondary | ICD-10-CM | POA: Diagnosis not present

## 2017-09-29 DIAGNOSIS — R262 Difficulty in walking, not elsewhere classified: Secondary | ICD-10-CM | POA: Diagnosis not present

## 2017-09-29 DIAGNOSIS — M6281 Muscle weakness (generalized): Secondary | ICD-10-CM | POA: Diagnosis not present

## 2017-10-04 DIAGNOSIS — M48061 Spinal stenosis, lumbar region without neurogenic claudication: Secondary | ICD-10-CM | POA: Diagnosis not present

## 2017-10-04 DIAGNOSIS — M6281 Muscle weakness (generalized): Secondary | ICD-10-CM | POA: Diagnosis not present

## 2017-10-04 DIAGNOSIS — R262 Difficulty in walking, not elsewhere classified: Secondary | ICD-10-CM | POA: Diagnosis not present

## 2017-10-06 DIAGNOSIS — M6281 Muscle weakness (generalized): Secondary | ICD-10-CM | POA: Diagnosis not present

## 2017-10-06 DIAGNOSIS — M48061 Spinal stenosis, lumbar region without neurogenic claudication: Secondary | ICD-10-CM | POA: Diagnosis not present

## 2017-10-06 DIAGNOSIS — R262 Difficulty in walking, not elsewhere classified: Secondary | ICD-10-CM | POA: Diagnosis not present

## 2017-10-11 DIAGNOSIS — M48061 Spinal stenosis, lumbar region without neurogenic claudication: Secondary | ICD-10-CM | POA: Diagnosis not present

## 2017-10-11 DIAGNOSIS — M6281 Muscle weakness (generalized): Secondary | ICD-10-CM | POA: Diagnosis not present

## 2017-10-11 DIAGNOSIS — R262 Difficulty in walking, not elsewhere classified: Secondary | ICD-10-CM | POA: Diagnosis not present

## 2017-10-25 DIAGNOSIS — C434 Malignant melanoma of scalp and neck: Secondary | ICD-10-CM | POA: Diagnosis not present

## 2017-10-25 DIAGNOSIS — C7931 Secondary malignant neoplasm of brain: Secondary | ICD-10-CM | POA: Diagnosis not present

## 2017-10-27 DIAGNOSIS — Z923 Personal history of irradiation: Secondary | ICD-10-CM | POA: Diagnosis not present

## 2017-10-27 DIAGNOSIS — Z9225 Personal history of immunosupression therapy: Secondary | ICD-10-CM | POA: Diagnosis not present

## 2017-10-27 DIAGNOSIS — Z85820 Personal history of malignant melanoma of skin: Secondary | ICD-10-CM | POA: Diagnosis not present

## 2017-10-27 DIAGNOSIS — M2550 Pain in unspecified joint: Secondary | ICD-10-CM | POA: Diagnosis not present

## 2017-10-27 DIAGNOSIS — Z9221 Personal history of antineoplastic chemotherapy: Secondary | ICD-10-CM | POA: Diagnosis not present

## 2017-10-27 DIAGNOSIS — Z85841 Personal history of malignant neoplasm of brain: Secondary | ICD-10-CM | POA: Diagnosis not present

## 2017-10-27 DIAGNOSIS — M5416 Radiculopathy, lumbar region: Secondary | ICD-10-CM | POA: Diagnosis not present

## 2017-10-27 DIAGNOSIS — C434 Malignant melanoma of scalp and neck: Secondary | ICD-10-CM | POA: Diagnosis not present

## 2017-10-27 DIAGNOSIS — C7931 Secondary malignant neoplasm of brain: Secondary | ICD-10-CM | POA: Diagnosis not present

## 2017-10-27 DIAGNOSIS — C7800 Secondary malignant neoplasm of unspecified lung: Secondary | ICD-10-CM | POA: Diagnosis not present

## 2017-10-27 DIAGNOSIS — R197 Diarrhea, unspecified: Secondary | ICD-10-CM | POA: Diagnosis not present

## 2017-10-27 DIAGNOSIS — Z9889 Other specified postprocedural states: Secondary | ICD-10-CM | POA: Diagnosis not present

## 2017-10-27 DIAGNOSIS — Z08 Encounter for follow-up examination after completed treatment for malignant neoplasm: Secondary | ICD-10-CM | POA: Diagnosis not present

## 2017-12-15 DIAGNOSIS — H01024 Squamous blepharitis left upper eyelid: Secondary | ICD-10-CM | POA: Diagnosis not present

## 2017-12-15 DIAGNOSIS — H16223 Keratoconjunctivitis sicca, not specified as Sjogren's, bilateral: Secondary | ICD-10-CM | POA: Diagnosis not present

## 2017-12-15 DIAGNOSIS — H01021 Squamous blepharitis right upper eyelid: Secondary | ICD-10-CM | POA: Diagnosis not present

## 2017-12-15 DIAGNOSIS — H2513 Age-related nuclear cataract, bilateral: Secondary | ICD-10-CM | POA: Diagnosis not present

## 2017-12-15 DIAGNOSIS — H01025 Squamous blepharitis left lower eyelid: Secondary | ICD-10-CM | POA: Diagnosis not present

## 2017-12-15 DIAGNOSIS — H01022 Squamous blepharitis right lower eyelid: Secondary | ICD-10-CM | POA: Diagnosis not present

## 2017-12-15 DIAGNOSIS — H16103 Unspecified superficial keratitis, bilateral: Secondary | ICD-10-CM | POA: Diagnosis not present

## 2017-12-15 DIAGNOSIS — H17823 Peripheral opacity of cornea, bilateral: Secondary | ICD-10-CM | POA: Diagnosis not present

## 2017-12-31 DIAGNOSIS — R7309 Other abnormal glucose: Secondary | ICD-10-CM | POA: Diagnosis not present

## 2017-12-31 DIAGNOSIS — K219 Gastro-esophageal reflux disease without esophagitis: Secondary | ICD-10-CM | POA: Diagnosis not present

## 2017-12-31 DIAGNOSIS — C799 Secondary malignant neoplasm of unspecified site: Secondary | ICD-10-CM | POA: Diagnosis not present

## 2017-12-31 DIAGNOSIS — I10 Essential (primary) hypertension: Secondary | ICD-10-CM | POA: Diagnosis not present

## 2017-12-31 DIAGNOSIS — E559 Vitamin D deficiency, unspecified: Secondary | ICD-10-CM | POA: Diagnosis not present

## 2017-12-31 DIAGNOSIS — K529 Noninfective gastroenteritis and colitis, unspecified: Secondary | ICD-10-CM | POA: Diagnosis not present

## 2017-12-31 DIAGNOSIS — N400 Enlarged prostate without lower urinary tract symptoms: Secondary | ICD-10-CM | POA: Diagnosis not present

## 2017-12-31 DIAGNOSIS — Z6823 Body mass index (BMI) 23.0-23.9, adult: Secondary | ICD-10-CM | POA: Diagnosis not present

## 2018-01-14 DIAGNOSIS — Z0000 Encounter for general adult medical examination without abnormal findings: Secondary | ICD-10-CM | POA: Diagnosis not present

## 2018-02-07 DIAGNOSIS — J069 Acute upper respiratory infection, unspecified: Secondary | ICD-10-CM | POA: Diagnosis not present

## 2018-02-07 DIAGNOSIS — J189 Pneumonia, unspecified organism: Secondary | ICD-10-CM | POA: Diagnosis not present

## 2018-02-07 DIAGNOSIS — Z6823 Body mass index (BMI) 23.0-23.9, adult: Secondary | ICD-10-CM | POA: Diagnosis not present

## 2018-02-08 ENCOUNTER — Other Ambulatory Visit: Payer: Self-pay | Admitting: Family Medicine

## 2018-02-08 ENCOUNTER — Ambulatory Visit
Admission: RE | Admit: 2018-02-08 | Discharge: 2018-02-08 | Disposition: A | Discharge status: Home / Self Care | Payer: Medicare Other | Source: Ambulatory Visit | Attending: Family Medicine | Admitting: Family Medicine

## 2018-02-08 DIAGNOSIS — J189 Pneumonia, unspecified organism: Secondary | ICD-10-CM

## 2018-02-08 DIAGNOSIS — R05 Cough: Secondary | ICD-10-CM | POA: Diagnosis not present

## 2018-02-11 DIAGNOSIS — Z6823 Body mass index (BMI) 23.0-23.9, adult: Secondary | ICD-10-CM | POA: Diagnosis not present

## 2018-02-11 DIAGNOSIS — J069 Acute upper respiratory infection, unspecified: Secondary | ICD-10-CM | POA: Diagnosis not present

## 2018-02-25 DIAGNOSIS — M419 Scoliosis, unspecified: Secondary | ICD-10-CM | POA: Diagnosis not present

## 2018-02-25 DIAGNOSIS — M545 Low back pain: Secondary | ICD-10-CM | POA: Diagnosis not present

## 2018-02-25 DIAGNOSIS — E109 Type 1 diabetes mellitus without complications: Secondary | ICD-10-CM | POA: Diagnosis not present

## 2018-02-25 DIAGNOSIS — M5136 Other intervertebral disc degeneration, lumbar region: Secondary | ICD-10-CM | POA: Diagnosis not present

## 2018-02-25 DIAGNOSIS — Z9889 Other specified postprocedural states: Secondary | ICD-10-CM | POA: Diagnosis not present

## 2018-02-25 DIAGNOSIS — M21371 Foot drop, right foot: Secondary | ICD-10-CM | POA: Diagnosis not present

## 2018-02-28 DIAGNOSIS — M21371 Foot drop, right foot: Secondary | ICD-10-CM | POA: Diagnosis not present

## 2018-02-28 DIAGNOSIS — M4726 Other spondylosis with radiculopathy, lumbar region: Secondary | ICD-10-CM | POA: Diagnosis not present

## 2018-02-28 DIAGNOSIS — M5416 Radiculopathy, lumbar region: Secondary | ICD-10-CM | POA: Diagnosis not present

## 2018-02-28 DIAGNOSIS — M5116 Intervertebral disc disorders with radiculopathy, lumbar region: Secondary | ICD-10-CM | POA: Diagnosis not present

## 2018-02-28 DIAGNOSIS — Z9889 Other specified postprocedural states: Secondary | ICD-10-CM | POA: Diagnosis not present

## 2018-03-01 ENCOUNTER — Other Ambulatory Visit: Payer: Self-pay | Admitting: Family Medicine

## 2018-03-01 DIAGNOSIS — M5416 Radiculopathy, lumbar region: Secondary | ICD-10-CM

## 2018-03-01 DIAGNOSIS — Z6823 Body mass index (BMI) 23.0-23.9, adult: Secondary | ICD-10-CM | POA: Diagnosis not present

## 2018-03-01 DIAGNOSIS — C799 Secondary malignant neoplasm of unspecified site: Secondary | ICD-10-CM | POA: Diagnosis not present

## 2018-03-01 DIAGNOSIS — M48062 Spinal stenosis, lumbar region with neurogenic claudication: Secondary | ICD-10-CM | POA: Diagnosis not present

## 2018-03-01 DIAGNOSIS — C7931 Secondary malignant neoplasm of brain: Secondary | ICD-10-CM | POA: Diagnosis not present

## 2018-03-13 ENCOUNTER — Ambulatory Visit
Admission: RE | Admit: 2018-03-13 | Discharge: 2018-03-13 | Disposition: A | Discharge status: Home / Self Care | Payer: PPO | Source: Ambulatory Visit | Attending: Family Medicine | Admitting: Family Medicine

## 2018-03-13 DIAGNOSIS — M48061 Spinal stenosis, lumbar region without neurogenic claudication: Secondary | ICD-10-CM | POA: Diagnosis not present

## 2018-03-13 DIAGNOSIS — M5416 Radiculopathy, lumbar region: Secondary | ICD-10-CM

## 2018-05-04 DIAGNOSIS — M7138 Other bursal cyst, other site: Secondary | ICD-10-CM | POA: Diagnosis not present

## 2018-05-04 DIAGNOSIS — R7309 Other abnormal glucose: Secondary | ICD-10-CM | POA: Diagnosis not present

## 2018-05-04 DIAGNOSIS — M5416 Radiculopathy, lumbar region: Secondary | ICD-10-CM | POA: Diagnosis not present

## 2018-05-04 DIAGNOSIS — M48061 Spinal stenosis, lumbar region without neurogenic claudication: Secondary | ICD-10-CM | POA: Diagnosis not present

## 2018-05-13 DIAGNOSIS — E11319 Type 2 diabetes mellitus with unspecified diabetic retinopathy without macular edema: Secondary | ICD-10-CM | POA: Diagnosis not present

## 2018-05-13 DIAGNOSIS — M5416 Radiculopathy, lumbar region: Secondary | ICD-10-CM | POA: Diagnosis not present

## 2018-05-13 DIAGNOSIS — M7138 Other bursal cyst, other site: Secondary | ICD-10-CM | POA: Diagnosis not present

## 2018-05-13 DIAGNOSIS — L905 Scar conditions and fibrosis of skin: Secondary | ICD-10-CM | POA: Diagnosis not present

## 2018-05-13 DIAGNOSIS — M48061 Spinal stenosis, lumbar region without neurogenic claudication: Secondary | ICD-10-CM | POA: Diagnosis not present

## 2018-05-15 DIAGNOSIS — R531 Weakness: Secondary | ICD-10-CM | POA: Diagnosis not present

## 2018-05-15 DIAGNOSIS — M545 Low back pain: Secondary | ICD-10-CM | POA: Diagnosis not present

## 2018-05-15 DIAGNOSIS — R200 Anesthesia of skin: Secondary | ICD-10-CM | POA: Diagnosis not present

## 2018-05-15 DIAGNOSIS — M79604 Pain in right leg: Secondary | ICD-10-CM | POA: Diagnosis not present

## 2018-05-15 DIAGNOSIS — G8918 Other acute postprocedural pain: Secondary | ICD-10-CM | POA: Diagnosis not present

## 2018-06-06 DIAGNOSIS — Z9889 Other specified postprocedural states: Secondary | ICD-10-CM | POA: Diagnosis not present

## 2018-06-10 DIAGNOSIS — Z125 Encounter for screening for malignant neoplasm of prostate: Secondary | ICD-10-CM | POA: Diagnosis not present

## 2018-06-10 DIAGNOSIS — E291 Testicular hypofunction: Secondary | ICD-10-CM | POA: Diagnosis not present

## 2018-06-10 DIAGNOSIS — E785 Hyperlipidemia, unspecified: Secondary | ICD-10-CM | POA: Diagnosis not present

## 2018-06-10 DIAGNOSIS — E119 Type 2 diabetes mellitus without complications: Secondary | ICD-10-CM | POA: Diagnosis not present

## 2018-06-21 DIAGNOSIS — C7931 Secondary malignant neoplasm of brain: Secondary | ICD-10-CM | POA: Diagnosis not present

## 2018-06-21 DIAGNOSIS — C799 Secondary malignant neoplasm of unspecified site: Secondary | ICD-10-CM | POA: Diagnosis not present

## 2018-06-21 DIAGNOSIS — C7800 Secondary malignant neoplasm of unspecified lung: Secondary | ICD-10-CM | POA: Diagnosis not present

## 2018-06-22 DIAGNOSIS — C801 Malignant (primary) neoplasm, unspecified: Secondary | ICD-10-CM | POA: Diagnosis not present

## 2018-06-22 DIAGNOSIS — C7931 Secondary malignant neoplasm of brain: Secondary | ICD-10-CM | POA: Diagnosis not present

## 2018-06-22 DIAGNOSIS — C7800 Secondary malignant neoplasm of unspecified lung: Secondary | ICD-10-CM | POA: Diagnosis not present

## 2018-06-22 DIAGNOSIS — K529 Noninfective gastroenteritis and colitis, unspecified: Secondary | ICD-10-CM | POA: Diagnosis not present

## 2018-06-22 DIAGNOSIS — Z85820 Personal history of malignant melanoma of skin: Secondary | ICD-10-CM | POA: Diagnosis not present

## 2018-07-05 DIAGNOSIS — C7931 Secondary malignant neoplasm of brain: Secondary | ICD-10-CM | POA: Diagnosis not present

## 2018-08-24 DIAGNOSIS — E119 Type 2 diabetes mellitus without complications: Secondary | ICD-10-CM | POA: Diagnosis not present

## 2018-08-24 DIAGNOSIS — R079 Chest pain, unspecified: Secondary | ICD-10-CM | POA: Diagnosis not present

## 2018-09-28 ENCOUNTER — Encounter: Payer: Self-pay | Admitting: Cardiovascular Disease

## 2018-09-28 ENCOUNTER — Ambulatory Visit (INDEPENDENT_AMBULATORY_CARE_PROVIDER_SITE_OTHER): Payer: PPO | Admitting: Cardiovascular Disease

## 2018-09-28 ENCOUNTER — Other Ambulatory Visit: Payer: Self-pay

## 2018-09-28 VITALS — BP 142/70 | HR 64 | Temp 98.7°F | Ht 63.0 in | Wt 141.0 lb

## 2018-09-28 DIAGNOSIS — M542 Cervicalgia: Secondary | ICD-10-CM | POA: Diagnosis not present

## 2018-09-28 DIAGNOSIS — R0789 Other chest pain: Secondary | ICD-10-CM | POA: Diagnosis not present

## 2018-09-28 NOTE — Progress Notes (Signed)
09/28/2018 Brent Sandoval   1955-12-10  [id number]  Primary Physician Brent Bien, MD Primary Cardiologist: Brent Harp MD Brent Sandoval, [name]  HPI:  Brent Sandoval is a 63 y.o. thin appearing married Caucasian male his wife is Brent Sandoval.  He is the father to step daughters, 1 of whom is a Magazine features editor at [employer] and one along Ship broker at [employer].  He was referred by Brent Sandoval for cardiovascular valuation because of atypical neck pain.  He has no risk factors except for iatrogenically caused diabetes.  There is no family history.  Is never had a heart attack or stroke.  He denies chest pain or shortness of breath.  He did have malignant melanoma which is metastatic to his brain and lung and has had chemotherapy.  Is been cancer free for at least a year.  He is a MD of oriental medicine and studied [place] medicine which you practice for 40 years. 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  He apparently had a chest CT that that did show some aortic calcification but I do not have those results.  Over the last number of months he has had some atypical neck pain occurring every month or 2 lasting for short period of time without other symptoms.  This usually occurs at night.  He denies chest pain or shortness of breath.   Current Meds  Medication Sig  . Cholecalciferol (VITAMIN D) 2000 UNITS CAPS Take by mouth daily.    . citalopram (CELEXA) 20 MG tablet Take 20 mg by mouth daily.    Marland Kitchen oxyCODONE-acetaminophen (PERCOCET) 5-325 MG per tablet Take 1 tablet by mouth every 8 (eight) hours as needed.  . zolpidem (AMBIEN) 10 MG tablet Take 0.5-1 tablets (5-10 mg total) by mouth at bedtime as needed for sleep.     Allergies  Allergen Reactions  . Diphenhydramine Hcl   . Morphine     Social History   Socioeconomic History  . Marital status: Married    Spouse name: Not on file  . Number of children: Not on file  . Years of education: Not on file  . Highest  education level: Not on file  Occupational History  . Not on file  Social Needs  . Financial resource strain: Not on file  . Food insecurity    Worry: Not on file    Inability: Not on file  . Transportation needs    Medical: Not on file    Non-medical: Not on file  Tobacco Use  . Smoking status: Never Smoker  . Smokeless tobacco: Never Used  . Tobacco comment: separated-Disabled from previous electrocution injury. Works as Chartered certified accountant  Substance and Sexual Activity  . Alcohol use: No  . Drug use: No  . Sexual activity: Not on file  Lifestyle  . Physical activity    Days per week: Not on file    Minutes per session: Not on file  . Stress: Not on file  Relationships  . Social Herbalist on phone: Not on file    Gets together: Not on file    Attends religious service: Not on file    Active member of club or organization: Not on file    Attends meetings of clubs or organizations: Not on file    Relationship status: Not on file  . Intimate partner violence    Fear of current or ex partner: Not on file  Emotionally abused: Not on file    Physically abused: Not on file    Forced sexual activity: Not on file  Other Topics Concern  . Not on file  Social History Narrative  . Not on file     Review of Systems: General: negative for chills, fever, night sweats or weight changes.  Cardiovascular: negative for chest pain, dyspnea on exertion, edema, orthopnea, palpitations, paroxysmal nocturnal dyspnea or shortness of breath Dermatological: negative for rash Respiratory: negative for cough or wheezing Urologic: negative for hematuria Abdominal: negative for nausea, vomiting, diarrhea, bright red blood per rectum, melena, or hematemesis Neurologic: negative for visual changes, syncope, or dizziness All other systems reviewed and are otherwise negative except as noted above.    Blood pressure (!) 142/70, pulse 64, temperature 98.7 F (37.1 C), height '5\' 3"'$  (1.6  m), weight 141 lb (64 kg).  General appearance: alert and no distress Neck: no adenopathy, no carotid bruit, no JVD, supple, symmetrical, trachea midline and thyroid not enlarged, symmetric, no tenderness/mass/nodules Lungs: clear to auscultation bilaterally Heart: regular rate and rhythm, S1, S2 normal, no murmur, click, rub or gallop Extremities: extremities normal, atraumatic, no cyanosis or edema Pulses: 2+ and symmetric Skin: Skin color, texture, turgor normal. No rashes or lesions Neurologic: Alert and oriented X 3, normal strength and tone. Normal symmetric reflexes. Normal coordination and gait  EKG normal sinus rhythm at 64 without ST or T wave changes.  I personally reviewed this EKG.  ASSESSMENT AND PLAN:   Atypical chest pain Mr. Deleon has atypical neck pain which usually occurs at night fairly infrequently (every 1 or 2 months).  He he has no other symptoms of ischemic heart disease.  He denies chest pain or shortness of breath.  He did have a chest CT that showed some aortic calcification.  I am going get a routine GXT to rule out a "anginal equivalent".      Brent Harp MD FACP,FACC,FAHA, [facility] 09/28/2018 12:21 PM

## 2018-09-28 NOTE — Assessment & Plan Note (Signed)
Brent Sandoval has atypical neck pain which usually occurs at night fairly infrequently (every 1 or 2 months).  He he has no other symptoms of ischemic heart disease.  He denies chest pain or shortness of breath.  He did have a chest CT that showed some aortic calcification.  I am going get a routine GXT to rule out a "anginal equivalent".

## 2018-09-28 NOTE — Patient Instructions (Addendum)
Medication Instructions:  Your physician recommends that you continue on your current medications as directed. Please refer to the Current Medication list given to you today.  If you need a refill on your cardiac medications before your next appointment, please call your pharmacy.   Lab work: none If you have labs (blood work) drawn today and your tests are completely normal, you will receive your results only by: Marland Kitchen MyChart Message (if you have MyChart) OR . A paper copy in the mail If you have any lab test that is abnormal or we need to change your treatment, we will call you to review the results.  Testing/Procedures: Your physician has requested that you have an exercise tolerance test. For further information please visit [email or web address]. Please also follow instruction sheet, as given.  Follow-Up: At [facility], you and your health needs are our priority.  As part of our continuing mission to provide you with exceptional heart care, we have created designated Provider Care Teams.  These Care Teams include your primary Cardiologist (physician) and Advanced Practice Providers (APPs -  Physician Assistants and Nurse Practitioners) who all work together to provide you with the care you need, when you need it. . You will need a follow up appointment as needed.  Any Other Special Instructions Will Be Listed Below (If Applicable).  Go to: [facility]  [address] [place], [place] [address] FOR YOUR COVID-19 TEST. YOU MUST HAVE YOUR COVID-19 TEST COMPLETED 3 DAYS PRIOR TO YOUR UPCOMING PROCEDURE/TEST. YOU WILL NEED AN APPOINTMENT. THIS WILL BE SET UP AFTER YOUR STRESS TEST IS SCHEDULED. YOU WILL ALSO NEED TO QUARANTINE YOURSELF AFTER THE COVID-19 TEST UNTIL THE DAY OF YOUR PROCEDURE/TEST. RESULTS WILL  BE POSTED IN OUR SYSTEM IN 48 HOURS OR LESS.

## 2018-10-21 DIAGNOSIS — K591 Functional diarrhea: Secondary | ICD-10-CM | POA: Diagnosis not present

## 2018-11-10 ENCOUNTER — Telehealth (HOSPITAL_COMMUNITY): Payer: Self-pay

## 2018-11-10 NOTE — Telephone Encounter (Signed)
New message    Call the patient to schedule a GXT order by Dr. Gwenlyn Found.   The patient is asking can he have the GXT perform at Dr. Dennie Fetters office.   The patient is asking for a call back

## 2018-11-10 NOTE — Telephone Encounter (Signed)
Spoke with pt who states he is okay with GXT at [facility]. He would like to have COVID-19 test done at his wife, Dr. Santiago Glad Richter's office. Informed him that message would be routed to scheduling to call him back to set up GXT appt and he will need to have COVID-19 test done 3 days prior to his GXT appt and self- isolate at home until day of GXT. Advised him to provide documentation of COVID-19 test results. Pt verbalized understanding

## 2018-11-30 DIAGNOSIS — D120 Benign neoplasm of cecum: Secondary | ICD-10-CM | POA: Diagnosis not present

## 2018-11-30 DIAGNOSIS — K529 Noninfective gastroenteritis and colitis, unspecified: Secondary | ICD-10-CM | POA: Diagnosis not present

## 2018-12-02 ENCOUNTER — Telehealth: Payer: Self-pay

## 2018-12-02 DIAGNOSIS — D120 Benign neoplasm of cecum: Secondary | ICD-10-CM | POA: Diagnosis not present

## 2018-12-02 NOTE — Telephone Encounter (Signed)
LM2CB-discuss COVID testing

## 2018-12-02 NOTE — Telephone Encounter (Signed)
-----   Message from Marcine Matar sent at 11/29/2018 10:16 AM EDT ----- Regarding: COVID test 8/18  [name] is aware- the patient will be having a colonoscopy on Wednesday, November 30, 2018, the patient is aware to have test results from Dr. Dennie Fetters office back to me next week aware if results are not back the GXT will be canceled.   There is a GXT scheduled on Friday, August 28,020, @ at 9:45 am patient of Dr. Quay Burow name Brent Sandoval.    Please call the patient and schedule/order COVID testing at [facility] the patient is aware the nurse will be calling to set up COVID testing and self-isolate after the COVID testing is performed until her appointment.    If the patient does not have the COVID testing performed and if we don't have the testing results back prior to the stress echo appointment, we will have to cancel the test.   [name]

## 2018-12-05 DIAGNOSIS — Z2089 Contact with and (suspected) exposure to other communicable diseases: Secondary | ICD-10-CM | POA: Diagnosis not present

## 2018-12-06 ENCOUNTER — Telehealth (HOSPITAL_COMMUNITY): Payer: Self-pay

## 2018-12-06 NOTE — Telephone Encounter (Signed)
Encounter complete. 

## 2018-12-08 ENCOUNTER — Telehealth (HOSPITAL_COMMUNITY): Payer: Self-pay

## 2018-12-08 NOTE — Telephone Encounter (Signed)
Encounter complete. 

## 2018-12-09 ENCOUNTER — Inpatient Hospital Stay (HOSPITAL_COMMUNITY): Admission: RE | Admit: 2018-12-09 | Payer: PPO | Source: Ambulatory Visit

## 2018-12-12 ENCOUNTER — Telehealth (HOSPITAL_COMMUNITY): Payer: Self-pay

## 2018-12-12 NOTE — Telephone Encounter (Signed)
New message    Just an FYI. We have made several attempts to contact this patient including sending a letter to schedule or reschedule their EXERCISE TOLERANCE TEST . We will be removing the patient from the WQ.   8.28.20 Cancel Rsn: Patient ( pt lvm to cx appt. has not had COVID testing done will cb to rsc /fc)  8.10.20 @ 3:41pm lm on home vm Rasheem Figiel 7.30.20 @ 10:30am patient wants gxt perform at another office - gesil 7.22.20 @ 11:29am lm on home vm- Keena Dinse 7.6.20 @ 11:28am, both # are the same -  lm on home vm - Evette Diclemente

## 2018-12-21 DIAGNOSIS — Z9889 Other specified postprocedural states: Secondary | ICD-10-CM | POA: Diagnosis not present

## 2018-12-21 DIAGNOSIS — C434 Malignant melanoma of scalp and neck: Secondary | ICD-10-CM | POA: Diagnosis not present

## 2018-12-21 DIAGNOSIS — N2889 Other specified disorders of kidney and ureter: Secondary | ICD-10-CM | POA: Diagnosis not present

## 2018-12-21 DIAGNOSIS — R918 Other nonspecific abnormal finding of lung field: Secondary | ICD-10-CM | POA: Diagnosis not present

## 2018-12-21 DIAGNOSIS — M25522 Pain in left elbow: Secondary | ICD-10-CM | POA: Diagnosis not present

## 2018-12-21 DIAGNOSIS — C7931 Secondary malignant neoplasm of brain: Secondary | ICD-10-CM | POA: Diagnosis not present

## 2018-12-21 DIAGNOSIS — M2550 Pain in unspecified joint: Secondary | ICD-10-CM | POA: Diagnosis not present

## 2018-12-21 DIAGNOSIS — Z85820 Personal history of malignant melanoma of skin: Secondary | ICD-10-CM | POA: Diagnosis not present

## 2018-12-21 DIAGNOSIS — M25549 Pain in joints of unspecified hand: Secondary | ICD-10-CM | POA: Diagnosis not present

## 2018-12-21 DIAGNOSIS — M25521 Pain in right elbow: Secondary | ICD-10-CM | POA: Diagnosis not present

## 2018-12-21 DIAGNOSIS — C799 Secondary malignant neoplasm of unspecified site: Secondary | ICD-10-CM | POA: Diagnosis not present

## 2018-12-21 DIAGNOSIS — C7800 Secondary malignant neoplasm of unspecified lung: Secondary | ICD-10-CM | POA: Diagnosis not present

## 2018-12-22 DIAGNOSIS — Z23 Encounter for immunization: Secondary | ICD-10-CM | POA: Diagnosis not present

## 2018-12-30 DIAGNOSIS — K219 Gastro-esophageal reflux disease without esophagitis: Secondary | ICD-10-CM | POA: Diagnosis not present

## 2018-12-30 DIAGNOSIS — C439 Malignant melanoma of skin, unspecified: Secondary | ICD-10-CM | POA: Diagnosis not present

## 2018-12-30 DIAGNOSIS — F411 Generalized anxiety disorder: Secondary | ICD-10-CM | POA: Diagnosis not present

## 2018-12-30 DIAGNOSIS — M48062 Spinal stenosis, lumbar region with neurogenic claudication: Secondary | ICD-10-CM | POA: Diagnosis not present

## 2018-12-30 DIAGNOSIS — K529 Noninfective gastroenteritis and colitis, unspecified: Secondary | ICD-10-CM | POA: Diagnosis not present

## 2018-12-30 DIAGNOSIS — C799 Secondary malignant neoplasm of unspecified site: Secondary | ICD-10-CM | POA: Diagnosis not present

## 2019-01-16 DIAGNOSIS — C7931 Secondary malignant neoplasm of brain: Secondary | ICD-10-CM | POA: Diagnosis not present

## 2019-01-16 DIAGNOSIS — M549 Dorsalgia, unspecified: Secondary | ICD-10-CM | POA: Diagnosis not present

## 2019-01-16 DIAGNOSIS — E119 Type 2 diabetes mellitus without complications: Secondary | ICD-10-CM | POA: Diagnosis not present

## 2019-01-16 DIAGNOSIS — Z0000 Encounter for general adult medical examination without abnormal findings: Secondary | ICD-10-CM | POA: Diagnosis not present

## 2019-01-16 DIAGNOSIS — K219 Gastro-esophageal reflux disease without esophagitis: Secondary | ICD-10-CM | POA: Diagnosis not present

## 2019-02-01 DIAGNOSIS — M7731 Calcaneal spur, right foot: Secondary | ICD-10-CM | POA: Diagnosis not present

## 2019-02-01 DIAGNOSIS — R519 Headache, unspecified: Secondary | ICD-10-CM | POA: Diagnosis not present

## 2019-02-01 DIAGNOSIS — R12 Heartburn: Secondary | ICD-10-CM | POA: Diagnosis not present

## 2019-02-01 DIAGNOSIS — R490 Dysphonia: Secondary | ICD-10-CM | POA: Diagnosis not present

## 2019-02-01 DIAGNOSIS — M6588 Other synovitis and tenosynovitis, other site: Secondary | ICD-10-CM | POA: Diagnosis not present

## 2019-02-01 DIAGNOSIS — M7732 Calcaneal spur, left foot: Secondary | ICD-10-CM | POA: Diagnosis not present

## 2019-02-01 DIAGNOSIS — C434 Malignant melanoma of scalp and neck: Secondary | ICD-10-CM | POA: Diagnosis not present

## 2019-02-01 DIAGNOSIS — R5383 Other fatigue: Secondary | ICD-10-CM | POA: Diagnosis not present

## 2019-02-01 DIAGNOSIS — T754XXS Electrocution, sequela: Secondary | ICD-10-CM | POA: Diagnosis not present

## 2019-02-01 DIAGNOSIS — M19072 Primary osteoarthritis, left ankle and foot: Secondary | ICD-10-CM | POA: Diagnosis not present

## 2019-02-01 DIAGNOSIS — M1811 Unilateral primary osteoarthritis of first carpometacarpal joint, right hand: Secondary | ICD-10-CM | POA: Diagnosis not present

## 2019-02-01 DIAGNOSIS — M7989 Other specified soft tissue disorders: Secondary | ICD-10-CM | POA: Diagnosis not present

## 2019-02-01 DIAGNOSIS — T451X5A Adverse effect of antineoplastic and immunosuppressive drugs, initial encounter: Secondary | ICD-10-CM | POA: Diagnosis not present

## 2019-02-01 DIAGNOSIS — M62838 Other muscle spasm: Secondary | ICD-10-CM | POA: Diagnosis not present

## 2019-02-01 DIAGNOSIS — R531 Weakness: Secondary | ICD-10-CM | POA: Diagnosis not present

## 2019-02-01 DIAGNOSIS — H5789 Other specified disorders of eye and adnexa: Secondary | ICD-10-CM | POA: Diagnosis not present

## 2019-02-01 DIAGNOSIS — R21 Rash and other nonspecific skin eruption: Secondary | ICD-10-CM | POA: Diagnosis not present

## 2019-02-01 DIAGNOSIS — C799 Secondary malignant neoplasm of unspecified site: Secondary | ICD-10-CM | POA: Diagnosis not present

## 2019-02-01 DIAGNOSIS — M1812 Unilateral primary osteoarthritis of first carpometacarpal joint, left hand: Secondary | ICD-10-CM | POA: Diagnosis not present

## 2019-02-01 DIAGNOSIS — F419 Anxiety disorder, unspecified: Secondary | ICD-10-CM | POA: Diagnosis not present

## 2019-02-01 DIAGNOSIS — M1990 Unspecified osteoarthritis, unspecified site: Secondary | ICD-10-CM | POA: Diagnosis not present

## 2019-02-01 DIAGNOSIS — Z7952 Long term (current) use of systemic steroids: Secondary | ICD-10-CM | POA: Diagnosis not present

## 2019-02-01 DIAGNOSIS — R197 Diarrhea, unspecified: Secondary | ICD-10-CM | POA: Diagnosis not present

## 2019-02-01 DIAGNOSIS — Z9049 Acquired absence of other specified parts of digestive tract: Secondary | ICD-10-CM | POA: Diagnosis not present

## 2019-02-01 DIAGNOSIS — C7931 Secondary malignant neoplasm of brain: Secondary | ICD-10-CM | POA: Diagnosis not present

## 2019-02-01 DIAGNOSIS — M19071 Primary osteoarthritis, right ankle and foot: Secondary | ICD-10-CM | POA: Diagnosis not present

## 2019-02-01 DIAGNOSIS — Z9081 Acquired absence of spleen: Secondary | ICD-10-CM | POA: Diagnosis not present

## 2019-02-01 DIAGNOSIS — M064 Inflammatory polyarthropathy: Secondary | ICD-10-CM | POA: Diagnosis not present

## 2019-02-01 DIAGNOSIS — C7800 Secondary malignant neoplasm of unspecified lung: Secondary | ICD-10-CM | POA: Diagnosis not present

## 2019-02-01 DIAGNOSIS — Z79899 Other long term (current) drug therapy: Secondary | ICD-10-CM | POA: Diagnosis not present

## 2019-02-06 DIAGNOSIS — Z2089 Contact with and (suspected) exposure to other communicable diseases: Secondary | ICD-10-CM | POA: Diagnosis not present

## 2019-03-16 DIAGNOSIS — I10 Essential (primary) hypertension: Secondary | ICD-10-CM | POA: Diagnosis not present

## 2019-03-16 DIAGNOSIS — Z125 Encounter for screening for malignant neoplasm of prostate: Secondary | ICD-10-CM | POA: Diagnosis not present

## 2019-03-16 DIAGNOSIS — E1165 Type 2 diabetes mellitus with hyperglycemia: Secondary | ICD-10-CM | POA: Diagnosis not present

## 2019-03-16 DIAGNOSIS — E291 Testicular hypofunction: Secondary | ICD-10-CM | POA: Diagnosis not present

## 2019-03-22 DIAGNOSIS — M8589 Other specified disorders of bone density and structure, multiple sites: Secondary | ICD-10-CM | POA: Diagnosis not present

## 2019-04-03 DIAGNOSIS — Z20828 Contact with and (suspected) exposure to other viral communicable diseases: Secondary | ICD-10-CM | POA: Diagnosis not present

## 2019-04-03 DIAGNOSIS — Z2089 Contact with and (suspected) exposure to other communicable diseases: Secondary | ICD-10-CM | POA: Diagnosis not present

## 2019-04-16 DIAGNOSIS — Z20822 Contact with and (suspected) exposure to covid-19: Secondary | ICD-10-CM | POA: Diagnosis not present

## 2019-04-16 DIAGNOSIS — Z20828 Contact with and (suspected) exposure to other viral communicable diseases: Secondary | ICD-10-CM | POA: Diagnosis not present

## 2019-04-16 DIAGNOSIS — Z2089 Contact with and (suspected) exposure to other communicable diseases: Secondary | ICD-10-CM | POA: Diagnosis not present

## 2019-04-21 DIAGNOSIS — Z20828 Contact with and (suspected) exposure to other viral communicable diseases: Secondary | ICD-10-CM | POA: Diagnosis not present

## 2019-04-21 DIAGNOSIS — Z20822 Contact with and (suspected) exposure to covid-19: Secondary | ICD-10-CM | POA: Diagnosis not present

## 2019-04-21 DIAGNOSIS — Z2089 Contact with and (suspected) exposure to other communicable diseases: Secondary | ICD-10-CM | POA: Diagnosis not present

## 2019-05-10 DIAGNOSIS — M8580 Other specified disorders of bone density and structure, unspecified site: Secondary | ICD-10-CM | POA: Diagnosis not present

## 2019-05-10 DIAGNOSIS — R748 Abnormal levels of other serum enzymes: Secondary | ICD-10-CM | POA: Diagnosis not present

## 2019-05-10 DIAGNOSIS — Z79899 Other long term (current) drug therapy: Secondary | ICD-10-CM | POA: Diagnosis not present

## 2019-05-10 DIAGNOSIS — M1990 Unspecified osteoarthritis, unspecified site: Secondary | ICD-10-CM | POA: Diagnosis not present

## 2019-05-10 DIAGNOSIS — R7401 Elevation of levels of liver transaminase levels: Secondary | ICD-10-CM | POA: Diagnosis not present

## 2019-05-10 DIAGNOSIS — M1389 Other specified arthritis, multiple sites: Secondary | ICD-10-CM | POA: Diagnosis not present

## 2019-05-10 DIAGNOSIS — E559 Vitamin D deficiency, unspecified: Secondary | ICD-10-CM | POA: Diagnosis not present

## 2019-05-10 DIAGNOSIS — Z7952 Long term (current) use of systemic steroids: Secondary | ICD-10-CM | POA: Diagnosis not present

## 2019-06-21 DIAGNOSIS — R590 Localized enlarged lymph nodes: Secondary | ICD-10-CM | POA: Diagnosis not present

## 2019-06-21 DIAGNOSIS — C7931 Secondary malignant neoplasm of brain: Secondary | ICD-10-CM | POA: Diagnosis not present

## 2019-06-21 DIAGNOSIS — L309 Dermatitis, unspecified: Secondary | ICD-10-CM | POA: Diagnosis not present

## 2019-06-21 DIAGNOSIS — C799 Secondary malignant neoplasm of unspecified site: Secondary | ICD-10-CM | POA: Diagnosis not present

## 2019-06-21 DIAGNOSIS — D72829 Elevated white blood cell count, unspecified: Secondary | ICD-10-CM | POA: Diagnosis not present

## 2019-06-21 DIAGNOSIS — C434 Malignant melanoma of scalp and neck: Secondary | ICD-10-CM | POA: Diagnosis not present

## 2019-06-21 DIAGNOSIS — M5416 Radiculopathy, lumbar region: Secondary | ICD-10-CM | POA: Diagnosis not present

## 2019-06-21 DIAGNOSIS — C7800 Secondary malignant neoplasm of unspecified lung: Secondary | ICD-10-CM | POA: Diagnosis not present

## 2019-06-21 DIAGNOSIS — K5730 Diverticulosis of large intestine without perforation or abscess without bleeding: Secondary | ICD-10-CM | POA: Diagnosis not present

## 2019-06-21 DIAGNOSIS — M2550 Pain in unspecified joint: Secondary | ICD-10-CM | POA: Diagnosis not present

## 2019-06-21 DIAGNOSIS — K529 Noninfective gastroenteritis and colitis, unspecified: Secondary | ICD-10-CM | POA: Diagnosis not present

## 2019-06-30 DIAGNOSIS — K219 Gastro-esophageal reflux disease without esophagitis: Secondary | ICD-10-CM | POA: Diagnosis not present

## 2019-06-30 DIAGNOSIS — M170 Bilateral primary osteoarthritis of knee: Secondary | ICD-10-CM | POA: Diagnosis not present

## 2019-06-30 DIAGNOSIS — G629 Polyneuropathy, unspecified: Secondary | ICD-10-CM | POA: Diagnosis not present

## 2019-06-30 DIAGNOSIS — F411 Generalized anxiety disorder: Secondary | ICD-10-CM | POA: Diagnosis not present

## 2019-07-10 DIAGNOSIS — A070 Balantidiasis: Secondary | ICD-10-CM | POA: Diagnosis not present

## 2019-07-13 DIAGNOSIS — M0510 Rheumatoid lung disease with rheumatoid arthritis of unspecified site: Secondary | ICD-10-CM | POA: Diagnosis not present

## 2019-07-13 DIAGNOSIS — Z20822 Contact with and (suspected) exposure to covid-19: Secondary | ICD-10-CM | POA: Diagnosis not present

## 2019-07-13 DIAGNOSIS — E1165 Type 2 diabetes mellitus with hyperglycemia: Secondary | ICD-10-CM | POA: Diagnosis not present

## 2019-07-13 DIAGNOSIS — C799 Secondary malignant neoplasm of unspecified site: Secondary | ICD-10-CM | POA: Diagnosis not present

## 2019-07-13 DIAGNOSIS — E119 Type 2 diabetes mellitus without complications: Secondary | ICD-10-CM | POA: Diagnosis not present

## 2019-07-13 DIAGNOSIS — E291 Testicular hypofunction: Secondary | ICD-10-CM | POA: Diagnosis not present

## 2019-07-13 DIAGNOSIS — N390 Urinary tract infection, site not specified: Secondary | ICD-10-CM | POA: Diagnosis not present

## 2019-07-13 DIAGNOSIS — R972 Elevated prostate specific antigen [PSA]: Secondary | ICD-10-CM | POA: Diagnosis not present

## 2019-09-19 DIAGNOSIS — K591 Functional diarrhea: Secondary | ICD-10-CM | POA: Diagnosis not present

## 2019-09-19 DIAGNOSIS — Z86010 Personal history of colonic polyps: Secondary | ICD-10-CM | POA: Diagnosis not present

## 2019-12-20 DIAGNOSIS — M25562 Pain in left knee: Secondary | ICD-10-CM | POA: Diagnosis not present

## 2019-12-20 DIAGNOSIS — R197 Diarrhea, unspecified: Secondary | ICD-10-CM | POA: Diagnosis not present

## 2019-12-20 DIAGNOSIS — C7800 Secondary malignant neoplasm of unspecified lung: Secondary | ICD-10-CM | POA: Diagnosis not present

## 2019-12-20 DIAGNOSIS — C7931 Secondary malignant neoplasm of brain: Secondary | ICD-10-CM | POA: Diagnosis not present

## 2019-12-20 DIAGNOSIS — Z923 Personal history of irradiation: Secondary | ICD-10-CM | POA: Diagnosis not present

## 2019-12-20 DIAGNOSIS — C434 Malignant melanoma of scalp and neck: Secondary | ICD-10-CM | POA: Diagnosis not present

## 2019-12-20 DIAGNOSIS — M25541 Pain in joints of right hand: Secondary | ICD-10-CM | POA: Diagnosis not present

## 2019-12-20 DIAGNOSIS — D72829 Elevated white blood cell count, unspecified: Secondary | ICD-10-CM | POA: Diagnosis not present

## 2019-12-20 DIAGNOSIS — M25561 Pain in right knee: Secondary | ICD-10-CM | POA: Diagnosis not present

## 2019-12-20 DIAGNOSIS — M25542 Pain in joints of left hand: Secondary | ICD-10-CM | POA: Diagnosis not present

## 2019-12-20 DIAGNOSIS — Z9889 Other specified postprocedural states: Secondary | ICD-10-CM | POA: Diagnosis not present

## 2019-12-20 DIAGNOSIS — M5416 Radiculopathy, lumbar region: Secondary | ICD-10-CM | POA: Diagnosis not present

## 2019-12-20 DIAGNOSIS — D72821 Monocytosis (symptomatic): Secondary | ICD-10-CM | POA: Diagnosis not present

## 2019-12-20 DIAGNOSIS — M2550 Pain in unspecified joint: Secondary | ICD-10-CM | POA: Diagnosis not present

## 2019-12-20 DIAGNOSIS — Z9221 Personal history of antineoplastic chemotherapy: Secondary | ICD-10-CM | POA: Diagnosis not present

## 2020-01-17 DIAGNOSIS — Z1331 Encounter for screening for depression: Secondary | ICD-10-CM | POA: Diagnosis not present

## 2020-01-17 DIAGNOSIS — C799 Secondary malignant neoplasm of unspecified site: Secondary | ICD-10-CM | POA: Diagnosis not present

## 2020-01-17 DIAGNOSIS — Z0000 Encounter for general adult medical examination without abnormal findings: Secondary | ICD-10-CM | POA: Diagnosis not present

## 2020-01-17 DIAGNOSIS — Z1339 Encounter for screening examination for other mental health and behavioral disorders: Secondary | ICD-10-CM | POA: Diagnosis not present

## 2020-01-17 DIAGNOSIS — E119 Type 2 diabetes mellitus without complications: Secondary | ICD-10-CM | POA: Diagnosis not present

## 2020-01-17 DIAGNOSIS — C439 Malignant melanoma of skin, unspecified: Secondary | ICD-10-CM | POA: Diagnosis not present

## 2020-01-19 DIAGNOSIS — N400 Enlarged prostate without lower urinary tract symptoms: Secondary | ICD-10-CM | POA: Diagnosis not present

## 2020-01-23 DIAGNOSIS — Z20822 Contact with and (suspected) exposure to covid-19: Secondary | ICD-10-CM | POA: Diagnosis not present

## 2020-01-27 IMAGING — DX DG CHEST 2V
2 series · 2 of 2 positions shown · non-contrast
Comparison: 06/20/2008

CLINICAL DATA: Cough.

EXAM:
CHEST - 2 VIEW

[dg chest 2 view (1 of 2)]
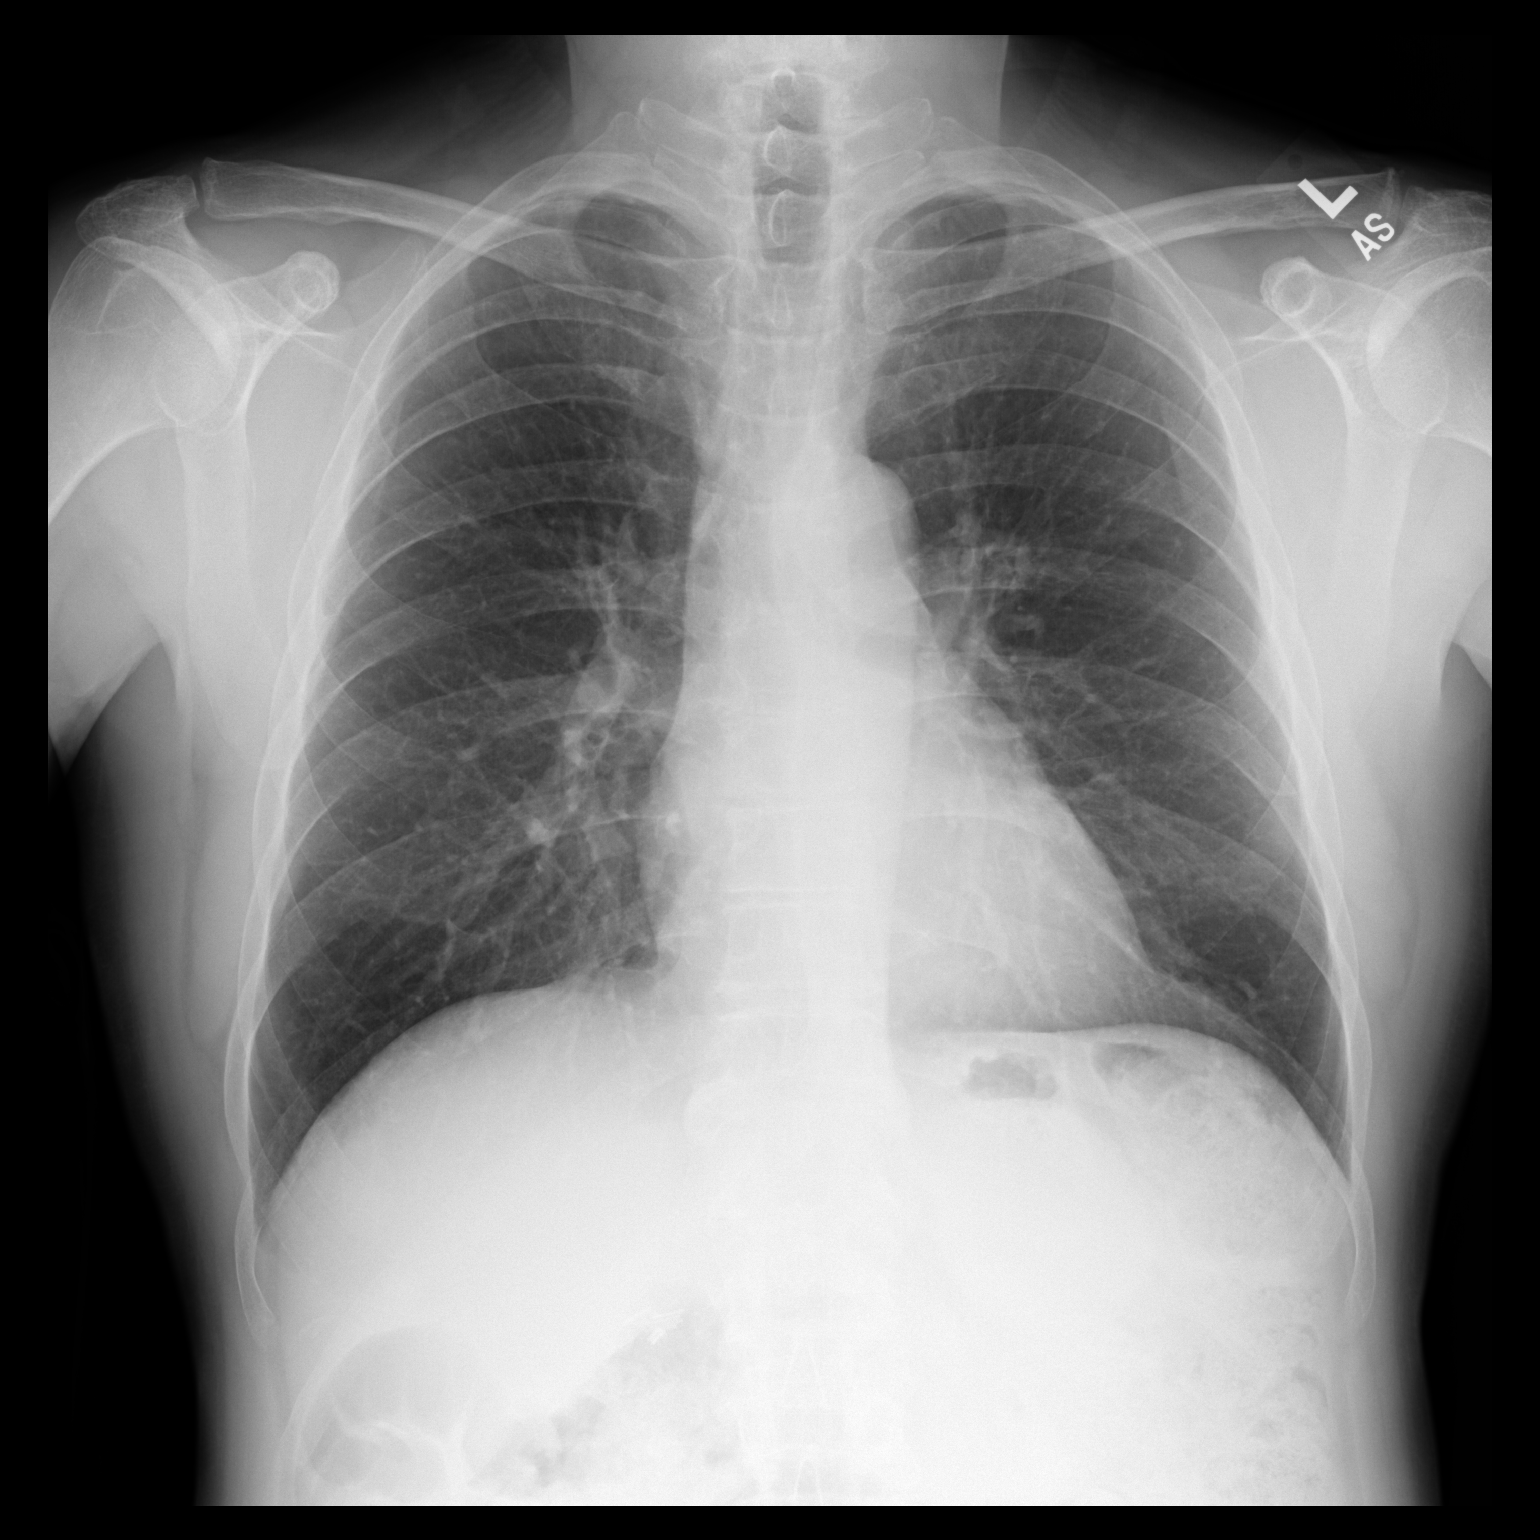

[dg chest 2 view (2 of 2)]
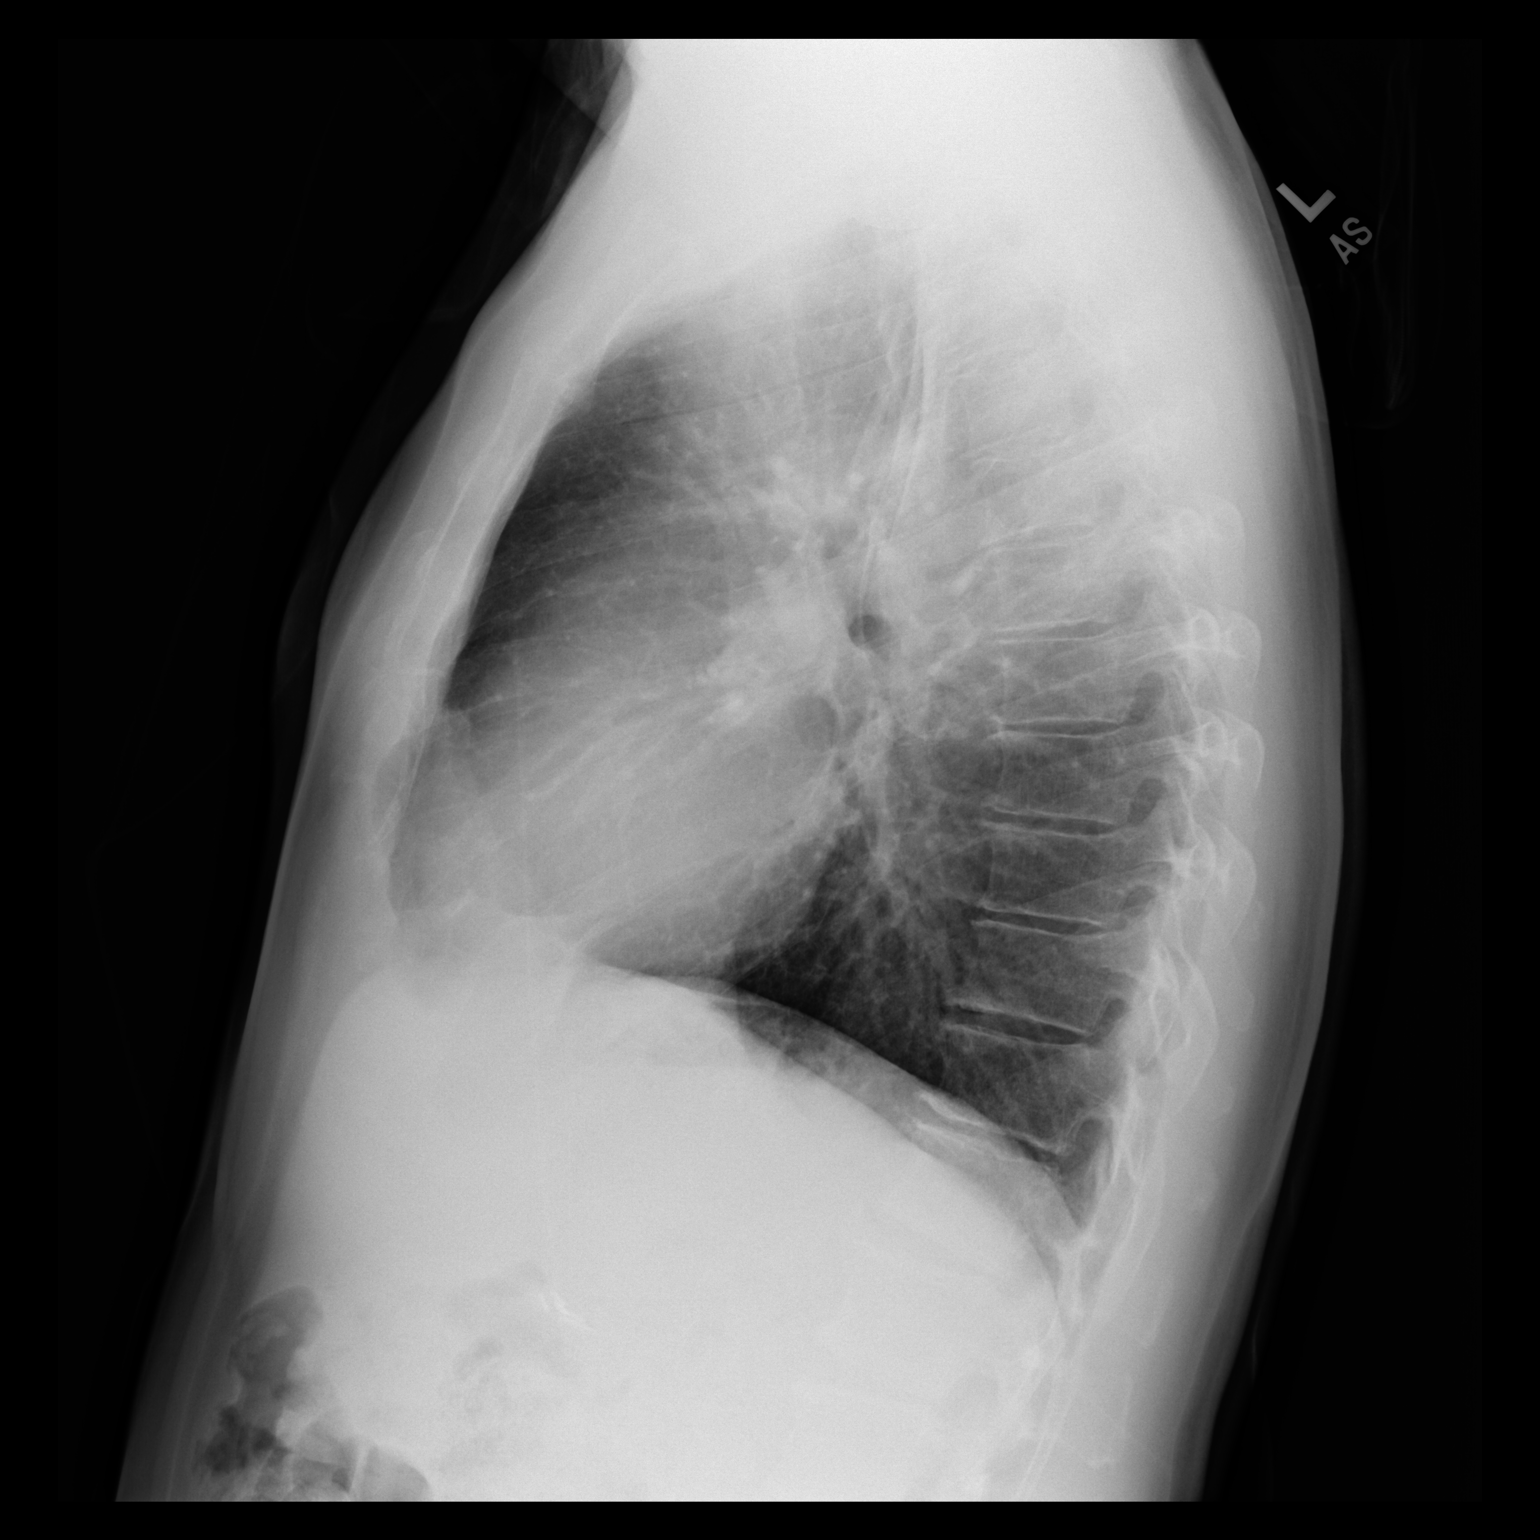

[2 of 2 positions shown; findings below may reference images not displayed]

FINDINGS: The cardiac silhouette, mediastinal and hilar contours are within
normal limits and stable. The lungs are clear. No pleural effusion.
The bony thorax is intact.
IMPRESSION: No acute cardiopulmonary findings.

## 2020-02-19 DIAGNOSIS — Z23 Encounter for immunization: Secondary | ICD-10-CM | POA: Diagnosis not present

## 2020-02-20 DIAGNOSIS — N400 Enlarged prostate without lower urinary tract symptoms: Secondary | ICD-10-CM | POA: Diagnosis not present

## 2020-02-20 DIAGNOSIS — R972 Elevated prostate specific antigen [PSA]: Secondary | ICD-10-CM | POA: Diagnosis not present

## 2020-02-21 DIAGNOSIS — F331 Major depressive disorder, recurrent, moderate: Secondary | ICD-10-CM | POA: Diagnosis not present

## 2020-02-21 DIAGNOSIS — K219 Gastro-esophageal reflux disease without esophagitis: Secondary | ICD-10-CM | POA: Diagnosis not present

## 2020-02-21 DIAGNOSIS — G4700 Insomnia, unspecified: Secondary | ICD-10-CM | POA: Diagnosis not present

## 2020-02-21 DIAGNOSIS — E1165 Type 2 diabetes mellitus with hyperglycemia: Secondary | ICD-10-CM | POA: Diagnosis not present

## 2020-02-21 DIAGNOSIS — F411 Generalized anxiety disorder: Secondary | ICD-10-CM | POA: Diagnosis not present

## 2020-02-21 DIAGNOSIS — R972 Elevated prostate specific antigen [PSA]: Secondary | ICD-10-CM | POA: Diagnosis not present

## 2020-02-29 IMAGING — MR MR LUMBAR SPINE W/O CM
4 of 5 series · 26 of 48 positions shown · non-contrast
Comparison: MRI lumbar spine 05/20/2010.

CLINICAL DATA: Low back pain radiating into the right leg and right
leg weakness for 6 months. History of prior lumbar surgery x2, most
recently in August 2017. No known injury.

EXAM:
MRI LUMBAR SPINE WITHOUT CONTRAST
TECHNIQUE: Multiplanar, multisequence MR imaging of the lumbar spine was
performed. No intravenous contrast was administered.

[Series 2: T2 · sagittal · 4.0mm · 1.09mm/px · 5 of 15 slices shown (1 of 2)]
[im 1/15]
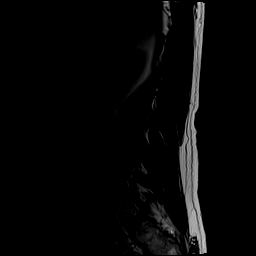
[im 4/15]
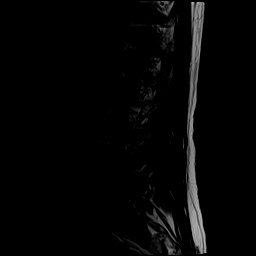
[im 8/15]
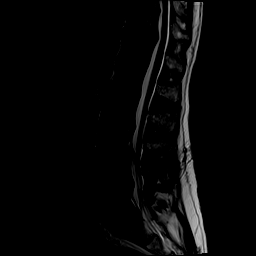
[im 11/15]
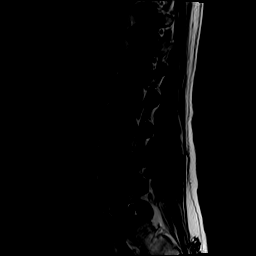
[im 15/15]
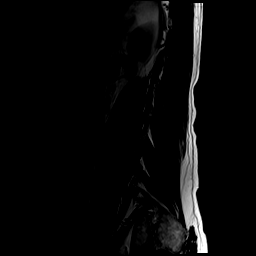

[Series 4: T1 · sagittal · 4.0mm · 1.09mm/px · 6 of 15 slices shown (1 of 2)]
[im 1/15]
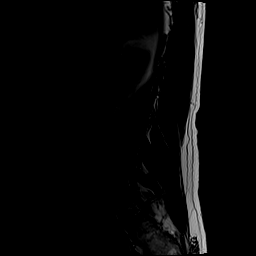
[im 3/15]
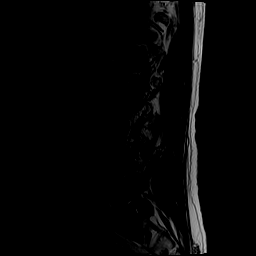
[im 6/15]
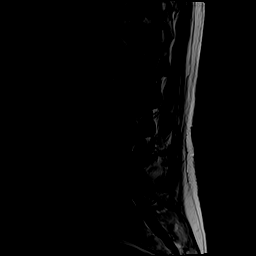
[im 9/15]
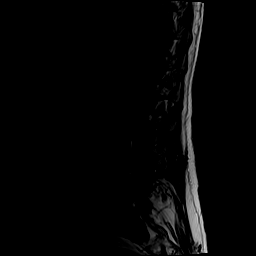
[im 12/15]
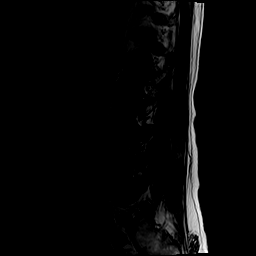
[im 15/15]
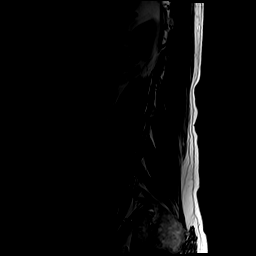

[Series 5: T2 · axial · 4.0mm · 0.39mm/px · z∈[-124,+95]mm · 10 of 42 slices shown (2 of 2)]
[im 3/42]
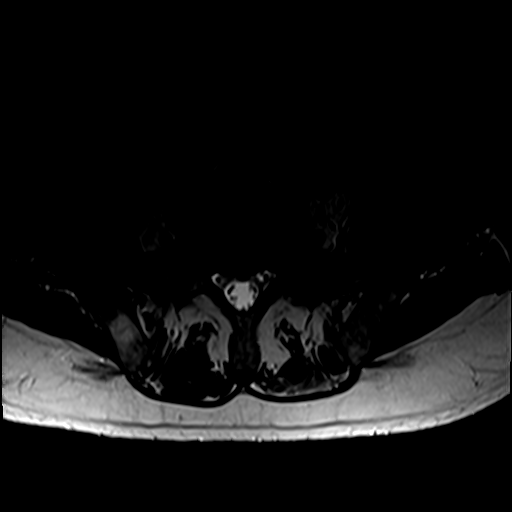
[im 6/42]
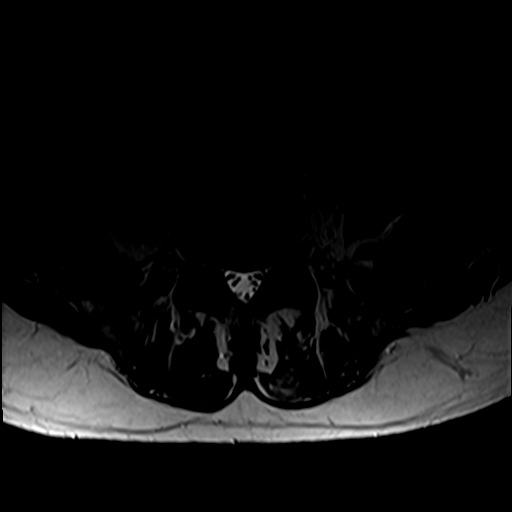
[im 9/42]
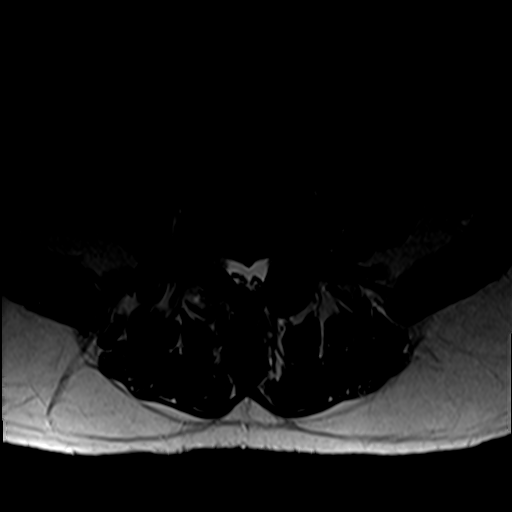
[im 14/42]
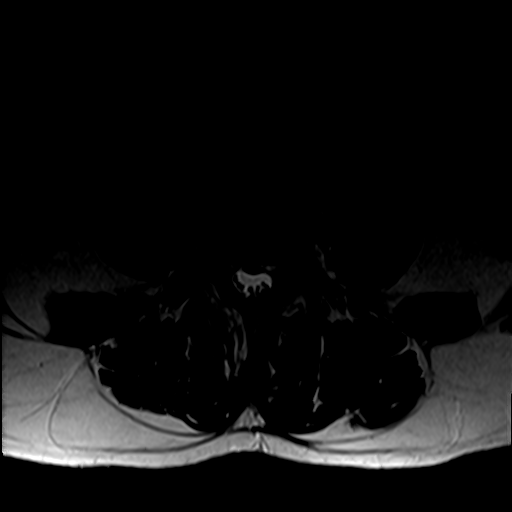
[im 20/42]
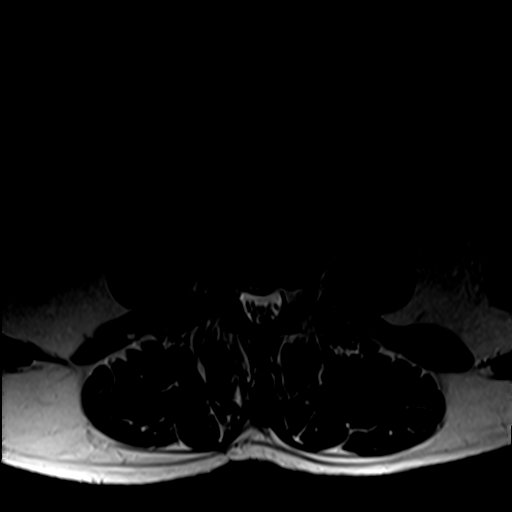
[im 22/42]
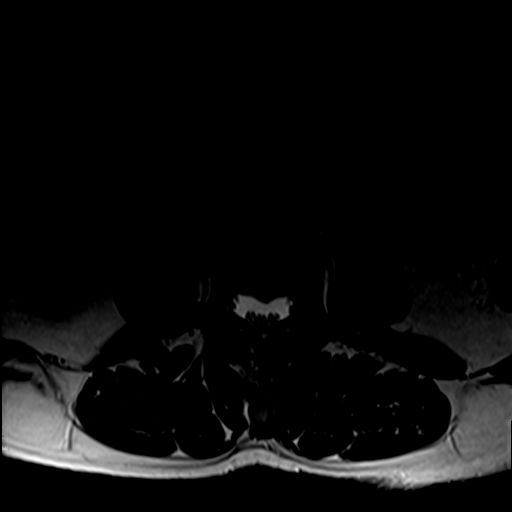
[im 25/42]
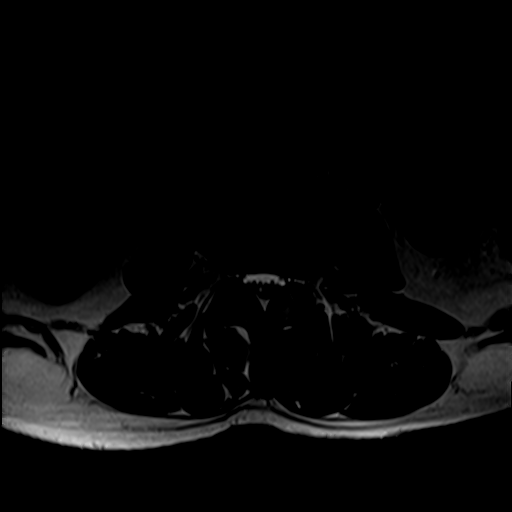
[im 31/42]
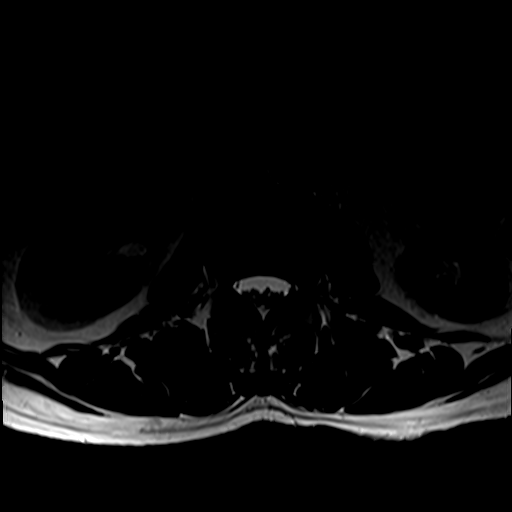
[im 36/42]
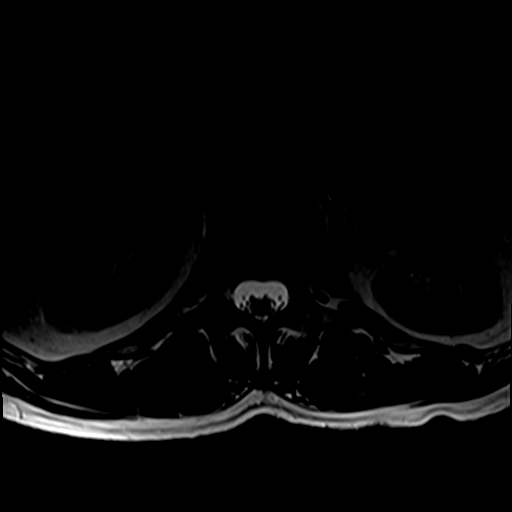
[im 42/42]
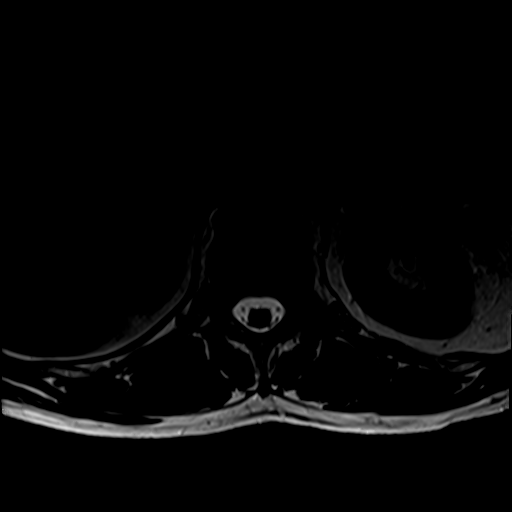

[Series 6: T1 · axial · 4.0mm · 0.39mm/px · z∈[-124,+66]mm · 5 of 42 slices shown (2 of 2)]
[im 3/42]
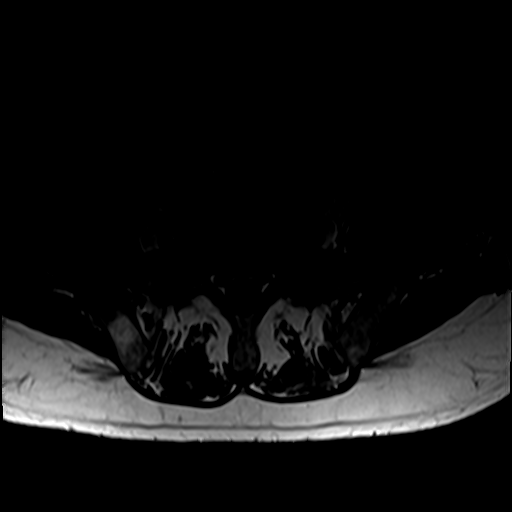
[im 6/42]
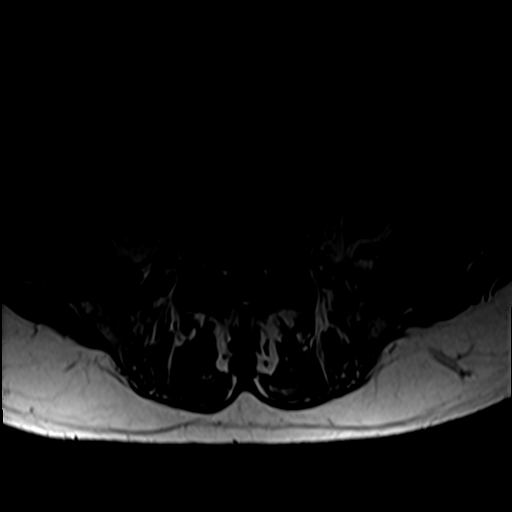
[im 9/42]
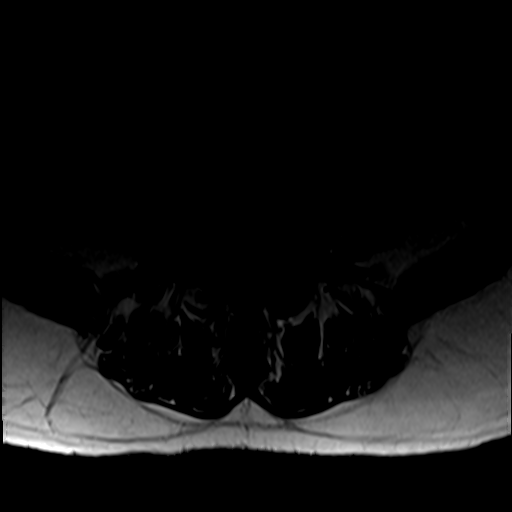
[im 22/42]
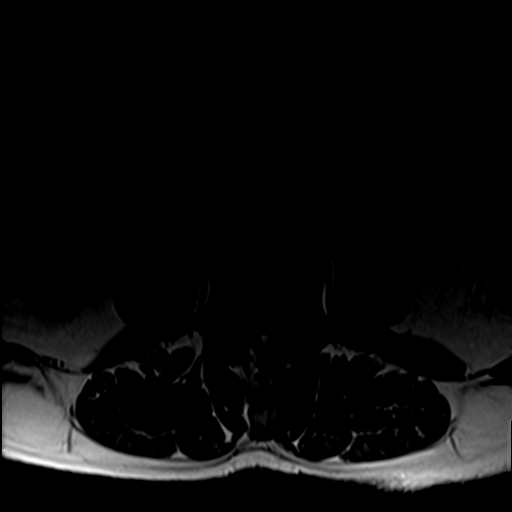
[im 36/42]
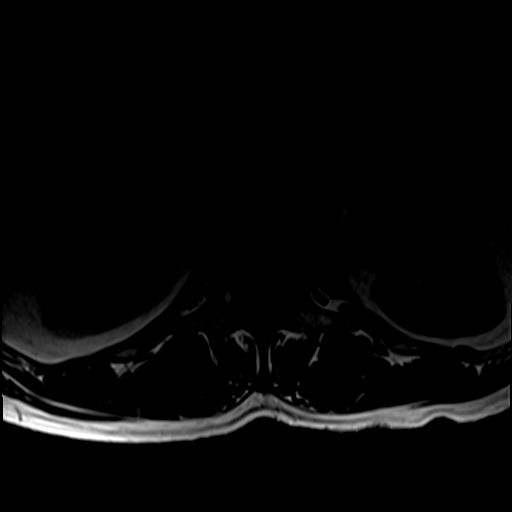

[26 of 48 positions shown; findings below may reference images not displayed]

FINDINGS: Segmentation:  Standard.

Alignment:  Maintained.

Vertebrae: No fracture or worrisome lesion. There is some
degenerative endplate signal change posteriorly and eccentric to the
right at L4-5.

Conus medullaris and cauda equina: Conus extends to the L1-2 level.
Conus and cauda equina appear normal.

Paraspinal and other soft tissues: Negative.

Disc levels:

T10-11 and T11-12 are imaged in the sagittal plane only. There is
shallow disc bulging at both levels, more prominent at T11-12 but
the central canal and foramina appear open.

T12-L1: Negative.

L1-2: Negative.

L2-3: Shallow disc bulge and mild ligamentum flavum thickening
without stenosis, unchanged.

L3-4: The patient is status post right laminotomy. Previously seen
small seroma in the laminotomy bed has resolved. There is a shallow
right paracentral protrusion superimposed on a disc bulge and
ligamentum flavum thickening on the left. Mild central canal
stenosis appears improved compared to the prior examination. There
is mild narrowing in the right lateral recess and foramina
bilaterally.

L4-5: The patient has undergone left laminotomy since the prior
examination. Broad-based disc bulge is identified but the central
canal is now open, improved compared the prior study. However, there
has been increase in protruding disc in the right foramen causing
moderately severe right foraminal stenosis, worse than on the prior
examination. Mild left foraminal narrowing is unchanged.

L5-S1: Minimal disc bulge without stenosis.
IMPRESSION: The patient has undergone left laminotomy at L4-5 since the prior
examination. Central canal stenosis seen on the prior study has been
relieved but there is increased right foraminal narrowing and
impingement on the exiting right L4 root due to disc in the foramen
and facet arthropathy. Mild left foraminal narrowing at this level
is unchanged.

Status post right laminotomy at L3-4. Small postoperative seroma in
the laminotomy bed has resolved since the prior MRI. Shallow disc
bulge and right paracentral protrusion cause mild narrowing in the
right subarticular recess and central canal overall at this level.

## 2020-03-12 DIAGNOSIS — E663 Overweight: Secondary | ICD-10-CM | POA: Diagnosis not present

## 2020-03-12 DIAGNOSIS — E1165 Type 2 diabetes mellitus with hyperglycemia: Secondary | ICD-10-CM | POA: Diagnosis not present

## 2020-03-12 DIAGNOSIS — K219 Gastro-esophageal reflux disease without esophagitis: Secondary | ICD-10-CM | POA: Diagnosis not present

## 2020-03-20 DIAGNOSIS — N400 Enlarged prostate without lower urinary tract symptoms: Secondary | ICD-10-CM | POA: Diagnosis not present

## 2020-03-20 DIAGNOSIS — R972 Elevated prostate specific antigen [PSA]: Secondary | ICD-10-CM | POA: Diagnosis not present

## 2020-03-25 DIAGNOSIS — Z23 Encounter for immunization: Secondary | ICD-10-CM | POA: Diagnosis not present

## 2020-03-26 DIAGNOSIS — R972 Elevated prostate specific antigen [PSA]: Secondary | ICD-10-CM | POA: Diagnosis not present

## 2020-03-26 DIAGNOSIS — L740 Miliaria rubra: Secondary | ICD-10-CM | POA: Diagnosis not present

## 2020-03-26 DIAGNOSIS — E1165 Type 2 diabetes mellitus with hyperglycemia: Secondary | ICD-10-CM | POA: Diagnosis not present

## 2020-03-26 DIAGNOSIS — C7931 Secondary malignant neoplasm of brain: Secondary | ICD-10-CM | POA: Diagnosis not present

## 2020-04-23 DIAGNOSIS — E663 Overweight: Secondary | ICD-10-CM | POA: Diagnosis not present

## 2020-04-23 DIAGNOSIS — K219 Gastro-esophageal reflux disease without esophagitis: Secondary | ICD-10-CM | POA: Diagnosis not present

## 2020-04-23 DIAGNOSIS — E1165 Type 2 diabetes mellitus with hyperglycemia: Secondary | ICD-10-CM | POA: Diagnosis not present

## 2020-05-20 DIAGNOSIS — R638 Other symptoms and signs concerning food and fluid intake: Secondary | ICD-10-CM | POA: Diagnosis not present

## 2020-05-20 DIAGNOSIS — K219 Gastro-esophageal reflux disease without esophagitis: Secondary | ICD-10-CM | POA: Diagnosis not present

## 2020-05-20 DIAGNOSIS — E1165 Type 2 diabetes mellitus with hyperglycemia: Secondary | ICD-10-CM | POA: Diagnosis not present

## 2020-05-27 DIAGNOSIS — R634 Abnormal weight loss: Secondary | ICD-10-CM | POA: Diagnosis not present

## 2020-05-27 DIAGNOSIS — E782 Mixed hyperlipidemia: Secondary | ICD-10-CM | POA: Diagnosis not present

## 2020-05-27 DIAGNOSIS — E1165 Type 2 diabetes mellitus with hyperglycemia: Secondary | ICD-10-CM | POA: Diagnosis not present

## 2020-05-27 DIAGNOSIS — C439 Malignant melanoma of skin, unspecified: Secondary | ICD-10-CM | POA: Diagnosis not present

## 2020-05-27 DIAGNOSIS — E1169 Type 2 diabetes mellitus with other specified complication: Secondary | ICD-10-CM | POA: Diagnosis not present

## 2020-05-27 DIAGNOSIS — Z7185 Encounter for immunization safety counseling: Secondary | ICD-10-CM | POA: Diagnosis not present

## 2020-05-27 DIAGNOSIS — I152 Hypertension secondary to endocrine disorders: Secondary | ICD-10-CM | POA: Diagnosis not present

## 2020-05-28 DIAGNOSIS — Z23 Encounter for immunization: Secondary | ICD-10-CM | POA: Diagnosis not present

## 2020-06-19 DIAGNOSIS — M2550 Pain in unspecified joint: Secondary | ICD-10-CM | POA: Diagnosis not present

## 2020-06-19 DIAGNOSIS — C7801 Secondary malignant neoplasm of right lung: Secondary | ICD-10-CM | POA: Diagnosis not present

## 2020-06-19 DIAGNOSIS — C7802 Secondary malignant neoplasm of left lung: Secondary | ICD-10-CM | POA: Diagnosis not present

## 2020-06-19 DIAGNOSIS — C7931 Secondary malignant neoplasm of brain: Secondary | ICD-10-CM | POA: Diagnosis not present

## 2020-06-19 DIAGNOSIS — C7800 Secondary malignant neoplasm of unspecified lung: Secondary | ICD-10-CM | POA: Diagnosis not present

## 2020-06-19 DIAGNOSIS — C434 Malignant melanoma of scalp and neck: Secondary | ICD-10-CM | POA: Diagnosis not present

## 2020-06-25 DIAGNOSIS — Z23 Encounter for immunization: Secondary | ICD-10-CM | POA: Diagnosis not present

## 2020-06-27 DIAGNOSIS — L578 Other skin changes due to chronic exposure to nonionizing radiation: Secondary | ICD-10-CM | POA: Diagnosis not present

## 2020-06-27 DIAGNOSIS — D1801 Hemangioma of skin and subcutaneous tissue: Secondary | ICD-10-CM | POA: Diagnosis not present

## 2020-06-27 DIAGNOSIS — L814 Other melanin hyperpigmentation: Secondary | ICD-10-CM | POA: Diagnosis not present

## 2020-06-27 DIAGNOSIS — L309 Dermatitis, unspecified: Secondary | ICD-10-CM | POA: Diagnosis not present

## 2020-06-27 DIAGNOSIS — L821 Other seborrheic keratosis: Secondary | ICD-10-CM | POA: Diagnosis not present

## 2020-06-27 DIAGNOSIS — Z85820 Personal history of malignant melanoma of skin: Secondary | ICD-10-CM | POA: Diagnosis not present

## 2020-08-19 DIAGNOSIS — H16223 Keratoconjunctivitis sicca, not specified as Sjogren's, bilateral: Secondary | ICD-10-CM | POA: Diagnosis not present

## 2020-08-19 DIAGNOSIS — E119 Type 2 diabetes mellitus without complications: Secondary | ICD-10-CM | POA: Diagnosis not present

## 2020-08-19 DIAGNOSIS — H17822 Peripheral opacity of cornea, left eye: Secondary | ICD-10-CM | POA: Diagnosis not present

## 2020-08-19 DIAGNOSIS — H25013 Cortical age-related cataract, bilateral: Secondary | ICD-10-CM | POA: Diagnosis not present

## 2020-08-22 DIAGNOSIS — N138 Other obstructive and reflux uropathy: Secondary | ICD-10-CM | POA: Diagnosis not present

## 2020-08-22 DIAGNOSIS — N401 Enlarged prostate with lower urinary tract symptoms: Secondary | ICD-10-CM | POA: Diagnosis not present

## 2020-08-22 DIAGNOSIS — R972 Elevated prostate specific antigen [PSA]: Secondary | ICD-10-CM | POA: Diagnosis not present

## 2020-08-26 DIAGNOSIS — H32 Chorioretinal disorders in diseases classified elsewhere: Secondary | ICD-10-CM | POA: Diagnosis not present

## 2020-08-26 DIAGNOSIS — R972 Elevated prostate specific antigen [PSA]: Secondary | ICD-10-CM | POA: Diagnosis not present

## 2020-08-26 DIAGNOSIS — M170 Bilateral primary osteoarthritis of knee: Secondary | ICD-10-CM | POA: Diagnosis not present

## 2020-08-26 DIAGNOSIS — M19049 Primary osteoarthritis, unspecified hand: Secondary | ICD-10-CM | POA: Diagnosis not present

## 2020-08-26 DIAGNOSIS — I152 Hypertension secondary to endocrine disorders: Secondary | ICD-10-CM | POA: Diagnosis not present

## 2020-08-26 DIAGNOSIS — N183 Chronic kidney disease, stage 3 unspecified: Secondary | ICD-10-CM | POA: Diagnosis not present

## 2020-08-26 DIAGNOSIS — K219 Gastro-esophageal reflux disease without esophagitis: Secondary | ICD-10-CM | POA: Diagnosis not present

## 2020-08-26 DIAGNOSIS — E559 Vitamin D deficiency, unspecified: Secondary | ICD-10-CM | POA: Diagnosis not present

## 2020-08-26 DIAGNOSIS — E1165 Type 2 diabetes mellitus with hyperglycemia: Secondary | ICD-10-CM | POA: Diagnosis not present

## 2020-08-26 DIAGNOSIS — E782 Mixed hyperlipidemia: Secondary | ICD-10-CM | POA: Diagnosis not present

## 2020-09-02 DIAGNOSIS — Z23 Encounter for immunization: Secondary | ICD-10-CM | POA: Diagnosis not present

## 2020-11-26 DIAGNOSIS — E782 Mixed hyperlipidemia: Secondary | ICD-10-CM | POA: Diagnosis not present

## 2020-11-26 DIAGNOSIS — I10 Essential (primary) hypertension: Secondary | ICD-10-CM | POA: Diagnosis not present

## 2020-11-26 DIAGNOSIS — E1165 Type 2 diabetes mellitus with hyperglycemia: Secondary | ICD-10-CM | POA: Diagnosis not present

## 2020-11-26 DIAGNOSIS — E559 Vitamin D deficiency, unspecified: Secondary | ICD-10-CM | POA: Diagnosis not present

## 2020-11-28 DIAGNOSIS — Z5320 Procedure and treatment not carried out because of patient's decision for unspecified reasons: Secondary | ICD-10-CM | POA: Diagnosis not present

## 2020-11-28 DIAGNOSIS — I10 Essential (primary) hypertension: Secondary | ICD-10-CM | POA: Diagnosis not present

## 2020-11-28 DIAGNOSIS — G629 Polyneuropathy, unspecified: Secondary | ICD-10-CM | POA: Diagnosis not present

## 2020-11-28 DIAGNOSIS — E1165 Type 2 diabetes mellitus with hyperglycemia: Secondary | ICD-10-CM | POA: Diagnosis not present

## 2020-11-28 DIAGNOSIS — M170 Bilateral primary osteoarthritis of knee: Secondary | ICD-10-CM | POA: Diagnosis not present

## 2020-11-28 DIAGNOSIS — M21379 Foot drop, unspecified foot: Secondary | ICD-10-CM | POA: Diagnosis not present

## 2020-11-28 DIAGNOSIS — E782 Mixed hyperlipidemia: Secondary | ICD-10-CM | POA: Diagnosis not present

## 2020-12-04 DIAGNOSIS — Z23 Encounter for immunization: Secondary | ICD-10-CM | POA: Diagnosis not present

## 2020-12-18 DIAGNOSIS — Z923 Personal history of irradiation: Secondary | ICD-10-CM | POA: Diagnosis not present

## 2020-12-18 DIAGNOSIS — C434 Malignant melanoma of scalp and neck: Secondary | ICD-10-CM | POA: Diagnosis not present

## 2020-12-18 DIAGNOSIS — Z9221 Personal history of antineoplastic chemotherapy: Secondary | ICD-10-CM | POA: Diagnosis not present

## 2020-12-18 DIAGNOSIS — M2550 Pain in unspecified joint: Secondary | ICD-10-CM | POA: Diagnosis not present

## 2020-12-18 DIAGNOSIS — C7931 Secondary malignant neoplasm of brain: Secondary | ICD-10-CM | POA: Diagnosis not present

## 2020-12-18 DIAGNOSIS — Z9889 Other specified postprocedural states: Secondary | ICD-10-CM | POA: Diagnosis not present

## 2020-12-18 DIAGNOSIS — C7800 Secondary malignant neoplasm of unspecified lung: Secondary | ICD-10-CM | POA: Diagnosis not present

## 2021-01-09 DIAGNOSIS — J45909 Unspecified asthma, uncomplicated: Secondary | ICD-10-CM | POA: Diagnosis not present

## 2021-01-09 DIAGNOSIS — J40 Bronchitis, not specified as acute or chronic: Secondary | ICD-10-CM | POA: Diagnosis not present

## 2021-01-09 DIAGNOSIS — U071 COVID-19: Secondary | ICD-10-CM | POA: Diagnosis not present

## 2021-01-13 DIAGNOSIS — J40 Bronchitis, not specified as acute or chronic: Secondary | ICD-10-CM | POA: Diagnosis not present

## 2021-01-13 DIAGNOSIS — J45990 Exercise induced bronchospasm: Secondary | ICD-10-CM | POA: Diagnosis not present

## 2021-01-13 DIAGNOSIS — J4540 Moderate persistent asthma, uncomplicated: Secondary | ICD-10-CM | POA: Diagnosis not present

## 2021-01-13 DIAGNOSIS — U071 COVID-19: Secondary | ICD-10-CM | POA: Diagnosis not present

## 2021-01-13 DIAGNOSIS — J45901 Unspecified asthma with (acute) exacerbation: Secondary | ICD-10-CM | POA: Diagnosis not present

## 2021-01-20 DIAGNOSIS — Z1331 Encounter for screening for depression: Secondary | ICD-10-CM | POA: Diagnosis not present

## 2021-01-20 DIAGNOSIS — J45909 Unspecified asthma, uncomplicated: Secondary | ICD-10-CM | POA: Diagnosis not present

## 2021-01-20 DIAGNOSIS — U071 COVID-19: Secondary | ICD-10-CM | POA: Diagnosis not present

## 2021-01-20 DIAGNOSIS — Z1339 Encounter for screening examination for other mental health and behavioral disorders: Secondary | ICD-10-CM | POA: Diagnosis not present

## 2021-01-20 DIAGNOSIS — Z0000 Encounter for general adult medical examination without abnormal findings: Secondary | ICD-10-CM | POA: Diagnosis not present

## 2021-01-20 DIAGNOSIS — J40 Bronchitis, not specified as acute or chronic: Secondary | ICD-10-CM | POA: Diagnosis not present

## 2021-01-30 DIAGNOSIS — J209 Acute bronchitis, unspecified: Secondary | ICD-10-CM | POA: Diagnosis not present

## 2021-01-30 DIAGNOSIS — J45909 Unspecified asthma, uncomplicated: Secondary | ICD-10-CM | POA: Diagnosis not present

## 2021-01-30 DIAGNOSIS — U071 COVID-19: Secondary | ICD-10-CM | POA: Diagnosis not present

## 2021-02-24 DIAGNOSIS — J45909 Unspecified asthma, uncomplicated: Secondary | ICD-10-CM | POA: Diagnosis not present

## 2021-02-24 DIAGNOSIS — E1165 Type 2 diabetes mellitus with hyperglycemia: Secondary | ICD-10-CM | POA: Diagnosis not present

## 2021-02-24 DIAGNOSIS — J40 Bronchitis, not specified as acute or chronic: Secondary | ICD-10-CM | POA: Diagnosis not present

## 2021-02-24 DIAGNOSIS — U071 COVID-19: Secondary | ICD-10-CM | POA: Diagnosis not present

## 2021-03-10 DIAGNOSIS — N481 Balanitis: Secondary | ICD-10-CM | POA: Diagnosis not present

## 2021-03-13 DIAGNOSIS — E1141 Type 2 diabetes mellitus with diabetic mononeuropathy: Secondary | ICD-10-CM | POA: Diagnosis not present

## 2021-03-13 DIAGNOSIS — Z66 Do not resuscitate: Secondary | ICD-10-CM | POA: Diagnosis not present

## 2021-03-13 DIAGNOSIS — Z7984 Long term (current) use of oral hypoglycemic drugs: Secondary | ICD-10-CM | POA: Diagnosis not present

## 2021-04-25 DIAGNOSIS — Z7984 Long term (current) use of oral hypoglycemic drugs: Secondary | ICD-10-CM | POA: Diagnosis not present

## 2021-04-25 DIAGNOSIS — M0600 Rheumatoid arthritis without rheumatoid factor, unspecified site: Secondary | ICD-10-CM | POA: Diagnosis not present

## 2021-04-25 DIAGNOSIS — K5190 Ulcerative colitis, unspecified, without complications: Secondary | ICD-10-CM | POA: Diagnosis not present

## 2021-04-25 DIAGNOSIS — E119 Type 2 diabetes mellitus without complications: Secondary | ICD-10-CM | POA: Diagnosis not present

## 2021-04-28 DIAGNOSIS — E559 Vitamin D deficiency, unspecified: Secondary | ICD-10-CM | POA: Diagnosis not present

## 2021-04-28 DIAGNOSIS — C439 Malignant melanoma of skin, unspecified: Secondary | ICD-10-CM | POA: Diagnosis not present

## 2021-04-28 DIAGNOSIS — E1165 Type 2 diabetes mellitus with hyperglycemia: Secondary | ICD-10-CM | POA: Diagnosis not present

## 2021-04-28 DIAGNOSIS — I10 Essential (primary) hypertension: Secondary | ICD-10-CM | POA: Diagnosis not present

## 2021-04-28 DIAGNOSIS — R946 Abnormal results of thyroid function studies: Secondary | ICD-10-CM | POA: Diagnosis not present

## 2021-05-06 DIAGNOSIS — Z85820 Personal history of malignant melanoma of skin: Secondary | ICD-10-CM | POA: Diagnosis not present

## 2021-05-06 DIAGNOSIS — D225 Melanocytic nevi of trunk: Secondary | ICD-10-CM | POA: Diagnosis not present

## 2021-05-06 DIAGNOSIS — D2230 Melanocytic nevi of unspecified part of face: Secondary | ICD-10-CM | POA: Diagnosis not present

## 2021-05-06 DIAGNOSIS — L821 Other seborrheic keratosis: Secondary | ICD-10-CM | POA: Diagnosis not present

## 2021-05-06 DIAGNOSIS — D485 Neoplasm of uncertain behavior of skin: Secondary | ICD-10-CM | POA: Diagnosis not present

## 2021-05-06 DIAGNOSIS — L578 Other skin changes due to chronic exposure to nonionizing radiation: Secondary | ICD-10-CM | POA: Diagnosis not present

## 2021-05-06 DIAGNOSIS — L720 Epidermal cyst: Secondary | ICD-10-CM | POA: Diagnosis not present

## 2021-05-06 DIAGNOSIS — L719 Rosacea, unspecified: Secondary | ICD-10-CM | POA: Diagnosis not present

## 2021-05-14 DIAGNOSIS — E782 Mixed hyperlipidemia: Secondary | ICD-10-CM | POA: Diagnosis not present

## 2021-05-14 DIAGNOSIS — R7989 Other specified abnormal findings of blood chemistry: Secondary | ICD-10-CM | POA: Diagnosis not present

## 2021-05-14 DIAGNOSIS — E1165 Type 2 diabetes mellitus with hyperglycemia: Secondary | ICD-10-CM | POA: Diagnosis not present

## 2021-05-14 DIAGNOSIS — E559 Vitamin D deficiency, unspecified: Secondary | ICD-10-CM | POA: Diagnosis not present

## 2021-05-14 DIAGNOSIS — I10 Essential (primary) hypertension: Secondary | ICD-10-CM | POA: Diagnosis not present

## 2021-06-18 DIAGNOSIS — C7931 Secondary malignant neoplasm of brain: Secondary | ICD-10-CM | POA: Diagnosis not present

## 2021-06-18 DIAGNOSIS — C438 Malignant melanoma of overlapping sites of skin: Secondary | ICD-10-CM | POA: Diagnosis not present

## 2021-06-18 DIAGNOSIS — C7800 Secondary malignant neoplasm of unspecified lung: Secondary | ICD-10-CM | POA: Diagnosis not present

## 2021-06-18 DIAGNOSIS — Z9889 Other specified postprocedural states: Secondary | ICD-10-CM | POA: Diagnosis not present

## 2021-06-18 DIAGNOSIS — E11319 Type 2 diabetes mellitus with unspecified diabetic retinopathy without macular edema: Secondary | ICD-10-CM | POA: Diagnosis not present

## 2021-06-18 DIAGNOSIS — Z923 Personal history of irradiation: Secondary | ICD-10-CM | POA: Diagnosis not present

## 2021-06-18 DIAGNOSIS — Z9221 Personal history of antineoplastic chemotherapy: Secondary | ICD-10-CM | POA: Diagnosis not present

## 2021-06-18 DIAGNOSIS — C434 Malignant melanoma of scalp and neck: Secondary | ICD-10-CM | POA: Diagnosis not present

## 2021-08-11 DIAGNOSIS — G629 Polyneuropathy, unspecified: Secondary | ICD-10-CM | POA: Diagnosis not present

## 2021-08-11 DIAGNOSIS — E1143 Type 2 diabetes mellitus with diabetic autonomic (poly)neuropathy: Secondary | ICD-10-CM | POA: Diagnosis not present

## 2021-08-11 DIAGNOSIS — E1165 Type 2 diabetes mellitus with hyperglycemia: Secondary | ICD-10-CM | POA: Diagnosis not present

## 2021-08-11 DIAGNOSIS — E782 Mixed hyperlipidemia: Secondary | ICD-10-CM | POA: Diagnosis not present

## 2021-08-11 DIAGNOSIS — J45909 Unspecified asthma, uncomplicated: Secondary | ICD-10-CM | POA: Diagnosis not present

## 2021-08-11 DIAGNOSIS — K219 Gastro-esophageal reflux disease without esophagitis: Secondary | ICD-10-CM | POA: Diagnosis not present

## 2021-08-11 DIAGNOSIS — E1169 Type 2 diabetes mellitus with other specified complication: Secondary | ICD-10-CM | POA: Diagnosis not present

## 2021-08-11 DIAGNOSIS — B3742 Candidal balanitis: Secondary | ICD-10-CM | POA: Diagnosis not present

## 2021-08-11 DIAGNOSIS — J309 Allergic rhinitis, unspecified: Secondary | ICD-10-CM | POA: Diagnosis not present

## 2021-08-13 DIAGNOSIS — Z23 Encounter for immunization: Secondary | ICD-10-CM | POA: Diagnosis not present

## 2021-08-13 DIAGNOSIS — E1165 Type 2 diabetes mellitus with hyperglycemia: Secondary | ICD-10-CM | POA: Diagnosis not present

## 2021-08-13 DIAGNOSIS — E782 Mixed hyperlipidemia: Secondary | ICD-10-CM | POA: Diagnosis not present

## 2021-08-13 DIAGNOSIS — E559 Vitamin D deficiency, unspecified: Secondary | ICD-10-CM | POA: Diagnosis not present

## 2021-08-21 DIAGNOSIS — L821 Other seborrheic keratosis: Secondary | ICD-10-CM | POA: Diagnosis not present

## 2021-08-21 DIAGNOSIS — D225 Melanocytic nevi of trunk: Secondary | ICD-10-CM | POA: Diagnosis not present

## 2021-08-21 DIAGNOSIS — Z85820 Personal history of malignant melanoma of skin: Secondary | ICD-10-CM | POA: Diagnosis not present

## 2021-08-21 DIAGNOSIS — D2271 Melanocytic nevi of right lower limb, including hip: Secondary | ICD-10-CM | POA: Diagnosis not present

## 2021-08-21 DIAGNOSIS — L578 Other skin changes due to chronic exposure to nonionizing radiation: Secondary | ICD-10-CM | POA: Diagnosis not present

## 2021-08-21 DIAGNOSIS — L720 Epidermal cyst: Secondary | ICD-10-CM | POA: Diagnosis not present

## 2021-08-21 DIAGNOSIS — D2230 Melanocytic nevi of unspecified part of face: Secondary | ICD-10-CM | POA: Diagnosis not present

## 2021-08-21 DIAGNOSIS — B353 Tinea pedis: Secondary | ICD-10-CM | POA: Diagnosis not present

## 2021-08-21 DIAGNOSIS — L719 Rosacea, unspecified: Secondary | ICD-10-CM | POA: Diagnosis not present

## 2021-08-22 DIAGNOSIS — E1165 Type 2 diabetes mellitus with hyperglycemia: Secondary | ICD-10-CM | POA: Diagnosis not present

## 2021-08-22 DIAGNOSIS — R7989 Other specified abnormal findings of blood chemistry: Secondary | ICD-10-CM | POA: Diagnosis not present

## 2021-08-22 DIAGNOSIS — E782 Mixed hyperlipidemia: Secondary | ICD-10-CM | POA: Diagnosis not present

## 2021-08-22 DIAGNOSIS — E1121 Type 2 diabetes mellitus with diabetic nephropathy: Secondary | ICD-10-CM | POA: Diagnosis not present

## 2021-08-22 DIAGNOSIS — C439 Malignant melanoma of skin, unspecified: Secondary | ICD-10-CM | POA: Diagnosis not present

## 2021-08-22 DIAGNOSIS — E559 Vitamin D deficiency, unspecified: Secondary | ICD-10-CM | POA: Diagnosis not present

## 2021-09-25 DIAGNOSIS — J069 Acute upper respiratory infection, unspecified: Secondary | ICD-10-CM | POA: Diagnosis not present

## 2021-09-25 DIAGNOSIS — B9689 Other specified bacterial agents as the cause of diseases classified elsewhere: Secondary | ICD-10-CM | POA: Diagnosis not present

## 2021-09-25 DIAGNOSIS — J329 Chronic sinusitis, unspecified: Secondary | ICD-10-CM | POA: Diagnosis not present

## 2021-10-17 DIAGNOSIS — Z9641 Presence of insulin pump (external) (internal): Secondary | ICD-10-CM | POA: Diagnosis not present

## 2021-10-17 DIAGNOSIS — Z794 Long term (current) use of insulin: Secondary | ICD-10-CM | POA: Diagnosis not present

## 2021-10-17 DIAGNOSIS — E119 Type 2 diabetes mellitus without complications: Secondary | ICD-10-CM | POA: Diagnosis not present

## 2021-11-14 DIAGNOSIS — E1165 Type 2 diabetes mellitus with hyperglycemia: Secondary | ICD-10-CM | POA: Diagnosis not present

## 2021-11-20 DIAGNOSIS — F331 Major depressive disorder, recurrent, moderate: Secondary | ICD-10-CM | POA: Diagnosis not present

## 2021-11-20 DIAGNOSIS — E1165 Type 2 diabetes mellitus with hyperglycemia: Secondary | ICD-10-CM | POA: Diagnosis not present

## 2021-11-20 DIAGNOSIS — E1121 Type 2 diabetes mellitus with diabetic nephropathy: Secondary | ICD-10-CM | POA: Diagnosis not present

## 2021-11-20 DIAGNOSIS — F411 Generalized anxiety disorder: Secondary | ICD-10-CM | POA: Diagnosis not present

## 2021-11-20 DIAGNOSIS — G4700 Insomnia, unspecified: Secondary | ICD-10-CM | POA: Diagnosis not present

## 2021-11-20 DIAGNOSIS — J309 Allergic rhinitis, unspecified: Secondary | ICD-10-CM | POA: Diagnosis not present

## 2021-12-30 DIAGNOSIS — R972 Elevated prostate specific antigen [PSA]: Secondary | ICD-10-CM | POA: Diagnosis not present

## 2022-01-01 DIAGNOSIS — R972 Elevated prostate specific antigen [PSA]: Secondary | ICD-10-CM | POA: Diagnosis not present

## 2022-01-01 DIAGNOSIS — R635 Abnormal weight gain: Secondary | ICD-10-CM | POA: Diagnosis not present

## 2022-01-01 DIAGNOSIS — I10 Essential (primary) hypertension: Secondary | ICD-10-CM | POA: Diagnosis not present

## 2022-01-01 DIAGNOSIS — E1165 Type 2 diabetes mellitus with hyperglycemia: Secondary | ICD-10-CM | POA: Diagnosis not present

## 2022-01-02 DIAGNOSIS — Z23 Encounter for immunization: Secondary | ICD-10-CM | POA: Diagnosis not present

## 2022-01-16 DIAGNOSIS — Z23 Encounter for immunization: Secondary | ICD-10-CM | POA: Diagnosis not present

## 2022-01-19 DIAGNOSIS — E1143 Type 2 diabetes mellitus with diabetic autonomic (poly)neuropathy: Secondary | ICD-10-CM | POA: Diagnosis not present

## 2022-01-19 DIAGNOSIS — E1165 Type 2 diabetes mellitus with hyperglycemia: Secondary | ICD-10-CM | POA: Diagnosis not present

## 2022-01-19 DIAGNOSIS — K219 Gastro-esophageal reflux disease without esophagitis: Secondary | ICD-10-CM | POA: Diagnosis not present

## 2022-01-19 DIAGNOSIS — K5900 Constipation, unspecified: Secondary | ICD-10-CM | POA: Diagnosis not present

## 2022-01-28 DIAGNOSIS — E1165 Type 2 diabetes mellitus with hyperglycemia: Secondary | ICD-10-CM | POA: Diagnosis not present

## 2022-01-28 DIAGNOSIS — R635 Abnormal weight gain: Secondary | ICD-10-CM | POA: Diagnosis not present

## 2022-01-28 DIAGNOSIS — R7989 Other specified abnormal findings of blood chemistry: Secondary | ICD-10-CM | POA: Diagnosis not present

## 2022-01-28 DIAGNOSIS — Z1339 Encounter for screening examination for other mental health and behavioral disorders: Secondary | ICD-10-CM | POA: Diagnosis not present

## 2022-01-28 DIAGNOSIS — Z1331 Encounter for screening for depression: Secondary | ICD-10-CM | POA: Diagnosis not present

## 2022-01-28 DIAGNOSIS — Z0000 Encounter for general adult medical examination without abnormal findings: Secondary | ICD-10-CM | POA: Diagnosis not present

## 2022-01-28 DIAGNOSIS — R972 Elevated prostate specific antigen [PSA]: Secondary | ICD-10-CM | POA: Diagnosis not present

## 2022-01-28 DIAGNOSIS — K219 Gastro-esophageal reflux disease without esophagitis: Secondary | ICD-10-CM | POA: Diagnosis not present

## 2022-01-28 DIAGNOSIS — E559 Vitamin D deficiency, unspecified: Secondary | ICD-10-CM | POA: Diagnosis not present

## 2022-01-28 DIAGNOSIS — K5900 Constipation, unspecified: Secondary | ICD-10-CM | POA: Diagnosis not present

## 2022-02-02 DIAGNOSIS — K219 Gastro-esophageal reflux disease without esophagitis: Secondary | ICD-10-CM | POA: Diagnosis not present

## 2022-02-02 DIAGNOSIS — E559 Vitamin D deficiency, unspecified: Secondary | ICD-10-CM | POA: Diagnosis not present

## 2022-02-02 DIAGNOSIS — E1165 Type 2 diabetes mellitus with hyperglycemia: Secondary | ICD-10-CM | POA: Diagnosis not present

## 2022-02-02 DIAGNOSIS — D72829 Elevated white blood cell count, unspecified: Secondary | ICD-10-CM | POA: Diagnosis not present

## 2022-02-02 DIAGNOSIS — K5900 Constipation, unspecified: Secondary | ICD-10-CM | POA: Diagnosis not present

## 2022-02-19 DIAGNOSIS — Z85820 Personal history of malignant melanoma of skin: Secondary | ICD-10-CM | POA: Diagnosis not present

## 2022-02-19 DIAGNOSIS — L719 Rosacea, unspecified: Secondary | ICD-10-CM | POA: Diagnosis not present

## 2022-02-19 DIAGNOSIS — L821 Other seborrheic keratosis: Secondary | ICD-10-CM | POA: Diagnosis not present

## 2022-02-19 DIAGNOSIS — D2271 Melanocytic nevi of right lower limb, including hip: Secondary | ICD-10-CM | POA: Diagnosis not present

## 2022-02-19 DIAGNOSIS — L80 Vitiligo: Secondary | ICD-10-CM | POA: Diagnosis not present

## 2022-02-19 DIAGNOSIS — L578 Other skin changes due to chronic exposure to nonionizing radiation: Secondary | ICD-10-CM | POA: Diagnosis not present

## 2022-02-19 DIAGNOSIS — B353 Tinea pedis: Secondary | ICD-10-CM | POA: Diagnosis not present

## 2022-02-19 DIAGNOSIS — D225 Melanocytic nevi of trunk: Secondary | ICD-10-CM | POA: Diagnosis not present

## 2022-02-19 DIAGNOSIS — D2230 Melanocytic nevi of unspecified part of face: Secondary | ICD-10-CM | POA: Diagnosis not present

## 2022-02-19 DIAGNOSIS — L720 Epidermal cyst: Secondary | ICD-10-CM | POA: Diagnosis not present

## 2022-02-25 DIAGNOSIS — E1165 Type 2 diabetes mellitus with hyperglycemia: Secondary | ICD-10-CM | POA: Diagnosis not present

## 2022-03-02 DIAGNOSIS — K5900 Constipation, unspecified: Secondary | ICD-10-CM | POA: Diagnosis not present

## 2022-03-02 DIAGNOSIS — E1165 Type 2 diabetes mellitus with hyperglycemia: Secondary | ICD-10-CM | POA: Diagnosis not present

## 2022-03-02 DIAGNOSIS — C439 Malignant melanoma of skin, unspecified: Secondary | ICD-10-CM | POA: Diagnosis not present

## 2022-03-02 DIAGNOSIS — K219 Gastro-esophageal reflux disease without esophagitis: Secondary | ICD-10-CM | POA: Diagnosis not present

## 2022-03-02 DIAGNOSIS — Z7185 Encounter for immunization safety counseling: Secondary | ICD-10-CM | POA: Diagnosis not present

## 2022-03-02 DIAGNOSIS — I10 Essential (primary) hypertension: Secondary | ICD-10-CM | POA: Diagnosis not present

## 2022-04-16 DIAGNOSIS — J329 Chronic sinusitis, unspecified: Secondary | ICD-10-CM | POA: Diagnosis not present

## 2022-04-16 DIAGNOSIS — J40 Bronchitis, not specified as acute or chronic: Secondary | ICD-10-CM | POA: Diagnosis not present

## 2022-04-16 DIAGNOSIS — J069 Acute upper respiratory infection, unspecified: Secondary | ICD-10-CM | POA: Diagnosis not present

## 2022-05-26 DIAGNOSIS — E1165 Type 2 diabetes mellitus with hyperglycemia: Secondary | ICD-10-CM | POA: Diagnosis not present

## 2022-06-02 DIAGNOSIS — M545 Low back pain, unspecified: Secondary | ICD-10-CM | POA: Diagnosis not present

## 2022-06-02 DIAGNOSIS — F331 Major depressive disorder, recurrent, moderate: Secondary | ICD-10-CM | POA: Diagnosis not present

## 2022-06-02 DIAGNOSIS — E1122 Type 2 diabetes mellitus with diabetic chronic kidney disease: Secondary | ICD-10-CM | POA: Diagnosis not present

## 2022-06-02 DIAGNOSIS — G4700 Insomnia, unspecified: Secondary | ICD-10-CM | POA: Diagnosis not present

## 2022-06-02 DIAGNOSIS — E1165 Type 2 diabetes mellitus with hyperglycemia: Secondary | ICD-10-CM | POA: Diagnosis not present

## 2022-06-02 DIAGNOSIS — I129 Hypertensive chronic kidney disease with stage 1 through stage 4 chronic kidney disease, or unspecified chronic kidney disease: Secondary | ICD-10-CM | POA: Diagnosis not present

## 2022-06-02 DIAGNOSIS — F411 Generalized anxiety disorder: Secondary | ICD-10-CM | POA: Diagnosis not present

## 2022-06-02 DIAGNOSIS — M5430 Sciatica, unspecified side: Secondary | ICD-10-CM | POA: Diagnosis not present

## 2022-06-15 DIAGNOSIS — C439 Malignant melanoma of skin, unspecified: Secondary | ICD-10-CM | POA: Diagnosis not present

## 2022-06-15 DIAGNOSIS — M5430 Sciatica, unspecified side: Secondary | ICD-10-CM | POA: Diagnosis not present

## 2022-06-15 DIAGNOSIS — Z23 Encounter for immunization: Secondary | ICD-10-CM | POA: Diagnosis not present

## 2022-06-15 DIAGNOSIS — E1165 Type 2 diabetes mellitus with hyperglycemia: Secondary | ICD-10-CM | POA: Diagnosis not present

## 2022-06-15 DIAGNOSIS — Z7185 Encounter for immunization safety counseling: Secondary | ICD-10-CM | POA: Diagnosis not present

## 2022-06-15 DIAGNOSIS — M545 Low back pain, unspecified: Secondary | ICD-10-CM | POA: Diagnosis not present

## 2022-06-17 DIAGNOSIS — Z9889 Other specified postprocedural states: Secondary | ICD-10-CM | POA: Diagnosis not present

## 2022-06-17 DIAGNOSIS — C7800 Secondary malignant neoplasm of unspecified lung: Secondary | ICD-10-CM | POA: Diagnosis not present

## 2022-06-17 DIAGNOSIS — K5730 Diverticulosis of large intestine without perforation or abscess without bleeding: Secondary | ICD-10-CM | POA: Diagnosis not present

## 2022-06-17 DIAGNOSIS — D72829 Elevated white blood cell count, unspecified: Secondary | ICD-10-CM | POA: Diagnosis not present

## 2022-06-17 DIAGNOSIS — Z5112 Encounter for antineoplastic immunotherapy: Secondary | ICD-10-CM | POA: Diagnosis not present

## 2022-06-17 DIAGNOSIS — C7931 Secondary malignant neoplasm of brain: Secondary | ICD-10-CM | POA: Diagnosis not present

## 2022-06-17 DIAGNOSIS — C434 Malignant melanoma of scalp and neck: Secondary | ICD-10-CM | POA: Diagnosis not present

## 2022-07-13 DIAGNOSIS — K589 Irritable bowel syndrome without diarrhea: Secondary | ICD-10-CM | POA: Diagnosis not present

## 2022-07-24 DIAGNOSIS — Z23 Encounter for immunization: Secondary | ICD-10-CM | POA: Diagnosis not present

## 2022-08-28 DIAGNOSIS — E782 Mixed hyperlipidemia: Secondary | ICD-10-CM | POA: Diagnosis not present

## 2022-08-28 DIAGNOSIS — I10 Essential (primary) hypertension: Secondary | ICD-10-CM | POA: Diagnosis not present

## 2022-08-28 DIAGNOSIS — E1165 Type 2 diabetes mellitus with hyperglycemia: Secondary | ICD-10-CM | POA: Diagnosis not present

## 2022-09-02 DIAGNOSIS — Z1159 Encounter for screening for other viral diseases: Secondary | ICD-10-CM | POA: Diagnosis not present

## 2022-09-02 DIAGNOSIS — I152 Hypertension secondary to endocrine disorders: Secondary | ICD-10-CM | POA: Diagnosis not present

## 2022-09-02 DIAGNOSIS — J329 Chronic sinusitis, unspecified: Secondary | ICD-10-CM | POA: Diagnosis not present

## 2022-09-02 DIAGNOSIS — J309 Allergic rhinitis, unspecified: Secondary | ICD-10-CM | POA: Diagnosis not present

## 2022-09-02 DIAGNOSIS — E1165 Type 2 diabetes mellitus with hyperglycemia: Secondary | ICD-10-CM | POA: Diagnosis not present

## 2022-09-02 DIAGNOSIS — B9689 Other specified bacterial agents as the cause of diseases classified elsewhere: Secondary | ICD-10-CM | POA: Diagnosis not present

## 2022-09-02 DIAGNOSIS — I10 Essential (primary) hypertension: Secondary | ICD-10-CM | POA: Diagnosis not present

## 2022-09-02 DIAGNOSIS — J4540 Moderate persistent asthma, uncomplicated: Secondary | ICD-10-CM | POA: Diagnosis not present

## 2022-09-02 DIAGNOSIS — E782 Mixed hyperlipidemia: Secondary | ICD-10-CM | POA: Diagnosis not present

## 2022-09-02 DIAGNOSIS — J069 Acute upper respiratory infection, unspecified: Secondary | ICD-10-CM | POA: Diagnosis not present

## 2022-09-02 DIAGNOSIS — C799 Secondary malignant neoplasm of unspecified site: Secondary | ICD-10-CM | POA: Diagnosis not present

## 2022-09-24 DIAGNOSIS — D2271 Melanocytic nevi of right lower limb, including hip: Secondary | ICD-10-CM | POA: Diagnosis not present

## 2022-09-24 DIAGNOSIS — L720 Epidermal cyst: Secondary | ICD-10-CM | POA: Diagnosis not present

## 2022-09-24 DIAGNOSIS — Z85820 Personal history of malignant melanoma of skin: Secondary | ICD-10-CM | POA: Diagnosis not present

## 2022-09-24 DIAGNOSIS — D2230 Melanocytic nevi of unspecified part of face: Secondary | ICD-10-CM | POA: Diagnosis not present

## 2022-09-24 DIAGNOSIS — L578 Other skin changes due to chronic exposure to nonionizing radiation: Secondary | ICD-10-CM | POA: Diagnosis not present

## 2022-09-24 DIAGNOSIS — D225 Melanocytic nevi of trunk: Secondary | ICD-10-CM | POA: Diagnosis not present

## 2022-09-24 DIAGNOSIS — L821 Other seborrheic keratosis: Secondary | ICD-10-CM | POA: Diagnosis not present

## 2022-09-24 DIAGNOSIS — L570 Actinic keratosis: Secondary | ICD-10-CM | POA: Diagnosis not present

## 2022-09-24 DIAGNOSIS — B353 Tinea pedis: Secondary | ICD-10-CM | POA: Diagnosis not present

## 2022-09-29 DIAGNOSIS — C7931 Secondary malignant neoplasm of brain: Secondary | ICD-10-CM | POA: Diagnosis not present

## 2022-09-29 DIAGNOSIS — C434 Malignant melanoma of scalp and neck: Secondary | ICD-10-CM | POA: Diagnosis not present

## 2022-12-03 DIAGNOSIS — R5383 Other fatigue: Secondary | ICD-10-CM | POA: Diagnosis not present

## 2022-12-03 DIAGNOSIS — R739 Hyperglycemia, unspecified: Secondary | ICD-10-CM | POA: Diagnosis not present

## 2022-12-03 DIAGNOSIS — E559 Vitamin D deficiency, unspecified: Secondary | ICD-10-CM | POA: Diagnosis not present

## 2022-12-03 DIAGNOSIS — E1165 Type 2 diabetes mellitus with hyperglycemia: Secondary | ICD-10-CM | POA: Diagnosis not present

## 2022-12-10 DIAGNOSIS — K219 Gastro-esophageal reflux disease without esophagitis: Secondary | ICD-10-CM | POA: Diagnosis not present

## 2022-12-10 DIAGNOSIS — E1165 Type 2 diabetes mellitus with hyperglycemia: Secondary | ICD-10-CM | POA: Diagnosis not present

## 2022-12-10 DIAGNOSIS — Z125 Encounter for screening for malignant neoplasm of prostate: Secondary | ICD-10-CM | POA: Diagnosis not present

## 2022-12-10 DIAGNOSIS — J4540 Moderate persistent asthma, uncomplicated: Secondary | ICD-10-CM | POA: Diagnosis not present

## 2022-12-10 DIAGNOSIS — E1121 Type 2 diabetes mellitus with diabetic nephropathy: Secondary | ICD-10-CM | POA: Diagnosis not present

## 2022-12-10 DIAGNOSIS — E559 Vitamin D deficiency, unspecified: Secondary | ICD-10-CM | POA: Diagnosis not present

## 2022-12-10 DIAGNOSIS — Z7185 Encounter for immunization safety counseling: Secondary | ICD-10-CM | POA: Diagnosis not present

## 2022-12-10 DIAGNOSIS — R972 Elevated prostate specific antigen [PSA]: Secondary | ICD-10-CM | POA: Diagnosis not present

## 2022-12-24 DIAGNOSIS — Z7185 Encounter for immunization safety counseling: Secondary | ICD-10-CM | POA: Diagnosis not present

## 2022-12-24 DIAGNOSIS — Z23 Encounter for immunization: Secondary | ICD-10-CM | POA: Diagnosis not present

## 2022-12-24 DIAGNOSIS — J309 Allergic rhinitis, unspecified: Secondary | ICD-10-CM | POA: Diagnosis not present

## 2022-12-24 DIAGNOSIS — E1165 Type 2 diabetes mellitus with hyperglycemia: Secondary | ICD-10-CM | POA: Diagnosis not present

## 2022-12-24 DIAGNOSIS — R972 Elevated prostate specific antigen [PSA]: Secondary | ICD-10-CM | POA: Diagnosis not present

## 2023-01-04 DIAGNOSIS — Z85820 Personal history of malignant melanoma of skin: Secondary | ICD-10-CM | POA: Diagnosis not present

## 2023-01-04 DIAGNOSIS — R972 Elevated prostate specific antigen [PSA]: Secondary | ICD-10-CM | POA: Diagnosis not present

## 2023-01-21 DIAGNOSIS — G4700 Insomnia, unspecified: Secondary | ICD-10-CM | POA: Diagnosis not present

## 2023-01-21 DIAGNOSIS — E1165 Type 2 diabetes mellitus with hyperglycemia: Secondary | ICD-10-CM | POA: Diagnosis not present

## 2023-01-21 DIAGNOSIS — Z23 Encounter for immunization: Secondary | ICD-10-CM | POA: Diagnosis not present

## 2023-01-21 DIAGNOSIS — F331 Major depressive disorder, recurrent, moderate: Secondary | ICD-10-CM | POA: Diagnosis not present

## 2023-01-21 DIAGNOSIS — F411 Generalized anxiety disorder: Secondary | ICD-10-CM | POA: Diagnosis not present

## 2023-02-10 DIAGNOSIS — I10 Essential (primary) hypertension: Secondary | ICD-10-CM | POA: Diagnosis not present

## 2023-02-10 DIAGNOSIS — G4700 Insomnia, unspecified: Secondary | ICD-10-CM | POA: Diagnosis not present

## 2023-02-10 DIAGNOSIS — E162 Hypoglycemia, unspecified: Secondary | ICD-10-CM | POA: Diagnosis not present

## 2023-02-10 DIAGNOSIS — E1165 Type 2 diabetes mellitus with hyperglycemia: Secondary | ICD-10-CM | POA: Diagnosis not present

## 2023-02-11 DIAGNOSIS — Z1331 Encounter for screening for depression: Secondary | ICD-10-CM | POA: Diagnosis not present

## 2023-02-11 DIAGNOSIS — Z1339 Encounter for screening examination for other mental health and behavioral disorders: Secondary | ICD-10-CM | POA: Diagnosis not present

## 2023-02-11 DIAGNOSIS — Z0000 Encounter for general adult medical examination without abnormal findings: Secondary | ICD-10-CM | POA: Diagnosis not present

## 2023-03-03 DIAGNOSIS — E1165 Type 2 diabetes mellitus with hyperglycemia: Secondary | ICD-10-CM | POA: Diagnosis not present

## 2023-03-03 DIAGNOSIS — I10 Essential (primary) hypertension: Secondary | ICD-10-CM | POA: Diagnosis not present

## 2023-03-03 DIAGNOSIS — M545 Low back pain, unspecified: Secondary | ICD-10-CM | POA: Diagnosis not present

## 2023-03-31 DIAGNOSIS — E1165 Type 2 diabetes mellitus with hyperglycemia: Secondary | ICD-10-CM | POA: Diagnosis not present

## 2023-04-21 DIAGNOSIS — L80 Vitiligo: Secondary | ICD-10-CM | POA: Diagnosis not present

## 2023-04-21 DIAGNOSIS — D2271 Melanocytic nevi of right lower limb, including hip: Secondary | ICD-10-CM | POA: Diagnosis not present

## 2023-04-21 DIAGNOSIS — L720 Epidermal cyst: Secondary | ICD-10-CM | POA: Diagnosis not present

## 2023-04-21 DIAGNOSIS — D225 Melanocytic nevi of trunk: Secondary | ICD-10-CM | POA: Diagnosis not present

## 2023-04-21 DIAGNOSIS — Z85820 Personal history of malignant melanoma of skin: Secondary | ICD-10-CM | POA: Diagnosis not present

## 2023-04-21 DIAGNOSIS — D2230 Melanocytic nevi of unspecified part of face: Secondary | ICD-10-CM | POA: Diagnosis not present

## 2023-04-21 DIAGNOSIS — L821 Other seborrheic keratosis: Secondary | ICD-10-CM | POA: Diagnosis not present

## 2023-04-21 DIAGNOSIS — L578 Other skin changes due to chronic exposure to nonionizing radiation: Secondary | ICD-10-CM | POA: Diagnosis not present

## 2023-06-16 DIAGNOSIS — C7931 Secondary malignant neoplasm of brain: Secondary | ICD-10-CM | POA: Diagnosis not present

## 2023-06-16 DIAGNOSIS — Z923 Personal history of irradiation: Secondary | ICD-10-CM | POA: Diagnosis not present

## 2023-06-16 DIAGNOSIS — C434 Malignant melanoma of scalp and neck: Secondary | ICD-10-CM | POA: Diagnosis not present

## 2023-06-16 DIAGNOSIS — Z9289 Personal history of other medical treatment: Secondary | ICD-10-CM | POA: Diagnosis not present

## 2023-06-16 DIAGNOSIS — C7800 Secondary malignant neoplasm of unspecified lung: Secondary | ICD-10-CM | POA: Diagnosis not present

## 2023-07-05 DIAGNOSIS — R972 Elevated prostate specific antigen [PSA]: Secondary | ICD-10-CM | POA: Diagnosis not present

## 2023-07-29 DIAGNOSIS — R739 Hyperglycemia, unspecified: Secondary | ICD-10-CM | POA: Diagnosis not present

## 2023-07-29 DIAGNOSIS — E559 Vitamin D deficiency, unspecified: Secondary | ICD-10-CM | POA: Diagnosis not present

## 2023-07-29 DIAGNOSIS — R5383 Other fatigue: Secondary | ICD-10-CM | POA: Diagnosis not present

## 2023-07-29 DIAGNOSIS — E785 Hyperlipidemia, unspecified: Secondary | ICD-10-CM | POA: Diagnosis not present

## 2023-08-05 DIAGNOSIS — E11319 Type 2 diabetes mellitus with unspecified diabetic retinopathy without macular edema: Secondary | ICD-10-CM | POA: Diagnosis not present

## 2023-08-05 DIAGNOSIS — J4540 Moderate persistent asthma, uncomplicated: Secondary | ICD-10-CM | POA: Diagnosis not present

## 2023-08-05 DIAGNOSIS — C7931 Secondary malignant neoplasm of brain: Secondary | ICD-10-CM | POA: Diagnosis not present

## 2023-08-05 DIAGNOSIS — E1165 Type 2 diabetes mellitus with hyperglycemia: Secondary | ICD-10-CM | POA: Diagnosis not present

## 2023-08-05 DIAGNOSIS — I152 Hypertension secondary to endocrine disorders: Secondary | ICD-10-CM | POA: Diagnosis not present

## 2023-08-05 DIAGNOSIS — E782 Mixed hyperlipidemia: Secondary | ICD-10-CM | POA: Diagnosis not present

## 2023-08-05 DIAGNOSIS — R972 Elevated prostate specific antigen [PSA]: Secondary | ICD-10-CM | POA: Diagnosis not present

## 2023-08-05 DIAGNOSIS — E1159 Type 2 diabetes mellitus with other circulatory complications: Secondary | ICD-10-CM | POA: Diagnosis not present

## 2023-08-05 DIAGNOSIS — E559 Vitamin D deficiency, unspecified: Secondary | ICD-10-CM | POA: Diagnosis not present

## 2023-08-05 DIAGNOSIS — Z789 Other specified health status: Secondary | ICD-10-CM | POA: Diagnosis not present

## 2023-08-05 DIAGNOSIS — I10 Essential (primary) hypertension: Secondary | ICD-10-CM | POA: Diagnosis not present

## 2023-11-01 DIAGNOSIS — R739 Hyperglycemia, unspecified: Secondary | ICD-10-CM | POA: Diagnosis not present

## 2023-11-09 DIAGNOSIS — J45990 Exercise induced bronchospasm: Secondary | ICD-10-CM | POA: Diagnosis not present

## 2023-11-09 DIAGNOSIS — J45901 Unspecified asthma with (acute) exacerbation: Secondary | ICD-10-CM | POA: Diagnosis not present

## 2023-11-09 DIAGNOSIS — K219 Gastro-esophageal reflux disease without esophagitis: Secondary | ICD-10-CM | POA: Diagnosis not present

## 2023-11-09 DIAGNOSIS — J309 Allergic rhinitis, unspecified: Secondary | ICD-10-CM | POA: Diagnosis not present

## 2023-11-09 DIAGNOSIS — E1165 Type 2 diabetes mellitus with hyperglycemia: Secondary | ICD-10-CM | POA: Diagnosis not present

## 2023-11-09 DIAGNOSIS — J4540 Moderate persistent asthma, uncomplicated: Secondary | ICD-10-CM | POA: Diagnosis not present

## 2023-11-10 DIAGNOSIS — L720 Epidermal cyst: Secondary | ICD-10-CM | POA: Diagnosis not present

## 2023-11-10 DIAGNOSIS — D2230 Melanocytic nevi of unspecified part of face: Secondary | ICD-10-CM | POA: Diagnosis not present

## 2023-11-10 DIAGNOSIS — D225 Melanocytic nevi of trunk: Secondary | ICD-10-CM | POA: Diagnosis not present

## 2023-11-10 DIAGNOSIS — L578 Other skin changes due to chronic exposure to nonionizing radiation: Secondary | ICD-10-CM | POA: Diagnosis not present

## 2023-11-10 DIAGNOSIS — L80 Vitiligo: Secondary | ICD-10-CM | POA: Diagnosis not present

## 2023-11-10 DIAGNOSIS — Z85820 Personal history of malignant melanoma of skin: Secondary | ICD-10-CM | POA: Diagnosis not present

## 2023-11-10 DIAGNOSIS — D239 Other benign neoplasm of skin, unspecified: Secondary | ICD-10-CM | POA: Diagnosis not present

## 2023-11-10 DIAGNOSIS — L821 Other seborrheic keratosis: Secondary | ICD-10-CM | POA: Diagnosis not present

## 2023-11-10 DIAGNOSIS — D2271 Melanocytic nevi of right lower limb, including hip: Secondary | ICD-10-CM | POA: Diagnosis not present

## 2024-01-04 DIAGNOSIS — N138 Other obstructive and reflux uropathy: Secondary | ICD-10-CM | POA: Diagnosis not present

## 2024-01-04 DIAGNOSIS — N401 Enlarged prostate with lower urinary tract symptoms: Secondary | ICD-10-CM | POA: Diagnosis not present

## 2024-01-11 ENCOUNTER — Encounter: Payer: Self-pay | Admitting: *Deleted

## 2024-01-11 NOTE — Progress Notes (Signed)
 Brent Sandoval                                          MRN: [id number]   01/11/2024   The [facility] Quality Team Specialist reviewed this patient medical record for the purposes of chart review for care gap closure. The following were reviewed: chart review for care gap closure-kidney health evaluation for diabetes:eGFR  and uACR.    [facility] Quality Team

## 2024-02-10 DIAGNOSIS — R351 Nocturia: Secondary | ICD-10-CM | POA: Diagnosis not present

## 2024-02-10 DIAGNOSIS — R5383 Other fatigue: Secondary | ICD-10-CM | POA: Diagnosis not present

## 2024-02-10 DIAGNOSIS — E785 Hyperlipidemia, unspecified: Secondary | ICD-10-CM | POA: Diagnosis not present

## 2024-02-10 DIAGNOSIS — Z125 Encounter for screening for malignant neoplasm of prostate: Secondary | ICD-10-CM | POA: Diagnosis not present

## 2024-02-10 DIAGNOSIS — R739 Hyperglycemia, unspecified: Secondary | ICD-10-CM | POA: Diagnosis not present

## 2024-02-16 DIAGNOSIS — E118 Type 2 diabetes mellitus with unspecified complications: Secondary | ICD-10-CM | POA: Diagnosis not present

## 2024-02-16 DIAGNOSIS — K5190 Ulcerative colitis, unspecified, without complications: Secondary | ICD-10-CM | POA: Diagnosis not present

## 2024-02-16 DIAGNOSIS — Z23 Encounter for immunization: Secondary | ICD-10-CM | POA: Diagnosis not present

## 2024-02-16 DIAGNOSIS — Z7185 Encounter for immunization safety counseling: Secondary | ICD-10-CM | POA: Diagnosis not present

## 2024-02-16 DIAGNOSIS — Z0000 Encounter for general adult medical examination without abnormal findings: Secondary | ICD-10-CM | POA: Diagnosis not present

## 2024-02-16 DIAGNOSIS — E1165 Type 2 diabetes mellitus with hyperglycemia: Secondary | ICD-10-CM | POA: Diagnosis not present

## 2024-02-16 DIAGNOSIS — M069 Rheumatoid arthritis, unspecified: Secondary | ICD-10-CM | POA: Diagnosis not present

## 2024-02-16 DIAGNOSIS — R972 Elevated prostate specific antigen [PSA]: Secondary | ICD-10-CM | POA: Diagnosis not present

## 2024-02-18 DIAGNOSIS — Z7185 Encounter for immunization safety counseling: Secondary | ICD-10-CM | POA: Diagnosis not present

## 2024-02-18 DIAGNOSIS — Z23 Encounter for immunization: Secondary | ICD-10-CM | POA: Diagnosis not present

## 2024-02-18 DIAGNOSIS — Z1331 Encounter for screening for depression: Secondary | ICD-10-CM | POA: Diagnosis not present

## 2024-02-18 DIAGNOSIS — Z1339 Encounter for screening examination for other mental health and behavioral disorders: Secondary | ICD-10-CM | POA: Diagnosis not present

## 2024-02-18 DIAGNOSIS — Z0000 Encounter for general adult medical examination without abnormal findings: Secondary | ICD-10-CM | POA: Diagnosis not present
